# Patient Record
Sex: Female | Born: 1980
Health system: Southern US, Community
[De-identification: ages and names within clinical notes are randomized; demographics above are authoritative.]

## PROBLEM LIST (undated history)

## (undated) DIAGNOSIS — K469 Unspecified abdominal hernia without obstruction or gangrene: Secondary | ICD-10-CM

## (undated) DIAGNOSIS — Z789 Other specified health status: Secondary | ICD-10-CM

## (undated) DIAGNOSIS — G43909 Migraine, unspecified, not intractable, without status migrainosus: Secondary | ICD-10-CM

## (undated) HISTORY — DX: Unspecified abdominal hernia without obstruction or gangrene: K46.9

## (undated) HISTORY — PX: INGUINAL HERNIA REPAIR: SUR1180

## (undated) HISTORY — DX: Migraine, unspecified, not intractable, without status migrainosus: G43.909

---
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
## 2002-09-05 HISTORY — PX: WISDOM TOOTH EXTRACTION: SHX21

## 2007-02-20 ENCOUNTER — Encounter: Payer: Self-pay | Admitting: Family Medicine

## 2007-10-29 ENCOUNTER — Ambulatory Visit: Payer: Self-pay | Admitting: Family Medicine

## 2007-10-31 ENCOUNTER — Encounter: Payer: Self-pay | Admitting: Family Medicine

## 2007-11-22 ENCOUNTER — Encounter: Payer: Self-pay | Admitting: Family Medicine

## 2008-05-15 ENCOUNTER — Ambulatory Visit: Payer: Self-pay | Admitting: Family Medicine

## 2008-05-15 DIAGNOSIS — J45909 Unspecified asthma, uncomplicated: Secondary | ICD-10-CM | POA: Insufficient documentation

## 2008-07-02 ENCOUNTER — Ambulatory Visit: Payer: Self-pay | Admitting: Family Medicine

## 2008-07-02 LAB — CONVERTED CEMR LAB: Rapid Strep: NEGATIVE

## 2008-07-30 ENCOUNTER — Encounter: Payer: Self-pay | Admitting: Family Medicine

## 2008-07-30 ENCOUNTER — Ambulatory Visit: Payer: Self-pay | Admitting: Family Medicine

## 2008-07-30 ENCOUNTER — Other Ambulatory Visit: Admission: RE | Admit: 2008-07-30 | Discharge: 2008-07-30 | Payer: Self-pay | Admitting: Family Medicine

## 2008-08-01 LAB — CONVERTED CEMR LAB
ALT: 17 units/L (ref 0–35)
Albumin: 4.5 g/dL (ref 3.5–5.2)
Alkaline Phosphatase: 56 units/L (ref 39–117)
CO2: 23 meq/L (ref 19–32)
Cholesterol: 194 mg/dL (ref 0–200)
LDL Cholesterol: 103 mg/dL — ABNORMAL HIGH (ref 0–99)
Potassium: 4.3 meq/L (ref 3.5–5.3)
Sodium: 135 meq/L (ref 135–145)
Total Bilirubin: 0.4 mg/dL (ref 0.3–1.2)
Total Protein: 7.4 g/dL (ref 6.0–8.3)
VLDL: 31 mg/dL (ref 0–40)

## 2008-08-04 LAB — CONVERTED CEMR LAB: Pap Smear: NORMAL

## 2008-08-26 ENCOUNTER — Ambulatory Visit: Payer: Self-pay | Admitting: Family Medicine

## 2008-08-26 ENCOUNTER — Encounter: Admission: RE | Admit: 2008-08-26 | Discharge: 2008-08-26 | Payer: Self-pay | Admitting: Family Medicine

## 2008-09-15 ENCOUNTER — Telehealth: Payer: Self-pay | Admitting: Family Medicine

## 2008-10-15 ENCOUNTER — Ambulatory Visit: Payer: Self-pay | Admitting: Family Medicine

## 2008-11-06 ENCOUNTER — Ambulatory Visit: Payer: Self-pay | Admitting: Obstetrics & Gynecology

## 2008-11-07 ENCOUNTER — Encounter: Payer: Self-pay | Admitting: Obstetrics and Gynecology

## 2008-11-07 LAB — CONVERTED CEMR LAB
Band Neutrophils: 0 % (ref 0–10)
Hemoglobin: 12.7 g/dL (ref 12.0–15.0)
Hepatitis B Surface Ag: NEGATIVE
Lymphocytes Relative: 23 % (ref 12–46)
Lymphs Abs: 1.8 10*3/uL (ref 0.7–4.0)
Neutrophils Relative %: 68 % (ref 43–77)
RBC: 4.54 M/uL (ref 3.87–5.11)
Rubella: 180 intl units/mL — ABNORMAL HIGH
WBC: 7.9 10*3/uL (ref 4.0–10.5)

## 2008-11-17 ENCOUNTER — Encounter: Payer: Self-pay | Admitting: Obstetrics and Gynecology

## 2008-11-17 ENCOUNTER — Ambulatory Visit: Payer: Self-pay | Admitting: Obstetrics and Gynecology

## 2008-12-03 ENCOUNTER — Ambulatory Visit: Payer: Self-pay | Admitting: Family Medicine

## 2008-12-08 ENCOUNTER — Ambulatory Visit (HOSPITAL_COMMUNITY): Admission: RE | Admit: 2008-12-08 | Discharge: 2008-12-08 | Payer: Self-pay | Admitting: Obstetrics & Gynecology

## 2008-12-11 ENCOUNTER — Telehealth: Payer: Self-pay | Admitting: Family Medicine

## 2008-12-12 ENCOUNTER — Ambulatory Visit: Payer: Self-pay | Admitting: Family

## 2009-01-01 ENCOUNTER — Ambulatory Visit (HOSPITAL_COMMUNITY): Admission: RE | Admit: 2009-01-01 | Discharge: 2009-01-01 | Payer: Self-pay | Admitting: Obstetrics & Gynecology

## 2009-01-12 ENCOUNTER — Ambulatory Visit: Payer: Self-pay | Admitting: Family

## 2009-01-15 ENCOUNTER — Ambulatory Visit (HOSPITAL_COMMUNITY): Admission: RE | Admit: 2009-01-15 | Discharge: 2009-01-15 | Payer: Self-pay | Admitting: Obstetrics & Gynecology

## 2009-01-30 ENCOUNTER — Ambulatory Visit: Payer: Self-pay | Admitting: Family Medicine

## 2009-02-09 ENCOUNTER — Ambulatory Visit: Payer: Self-pay | Admitting: Family

## 2009-02-26 ENCOUNTER — Ambulatory Visit (HOSPITAL_COMMUNITY): Admission: RE | Admit: 2009-02-26 | Discharge: 2009-02-26 | Payer: Self-pay | Admitting: Obstetrics & Gynecology

## 2009-03-06 ENCOUNTER — Ambulatory Visit: Payer: Self-pay | Admitting: Obstetrics and Gynecology

## 2009-03-26 ENCOUNTER — Ambulatory Visit (HOSPITAL_COMMUNITY): Admission: RE | Admit: 2009-03-26 | Discharge: 2009-03-26 | Payer: Self-pay | Admitting: Obstetrics & Gynecology

## 2009-03-27 ENCOUNTER — Ambulatory Visit: Payer: Self-pay | Admitting: Obstetrics and Gynecology

## 2009-03-27 LAB — CONVERTED CEMR LAB
HCT: 32.2 % — ABNORMAL LOW (ref 36.0–46.0)
Hemoglobin: 10.8 g/dL — ABNORMAL LOW (ref 12.0–15.0)
MCHC: 33.5 g/dL (ref 30.0–36.0)
MCV: 89.7 fL (ref 78.0–100.0)
RDW: 13.8 % (ref 11.5–15.5)

## 2009-03-31 ENCOUNTER — Encounter: Payer: Self-pay | Admitting: Obstetrics and Gynecology

## 2009-04-10 ENCOUNTER — Ambulatory Visit: Payer: Self-pay | Admitting: Family

## 2009-04-27 ENCOUNTER — Ambulatory Visit: Payer: Self-pay | Admitting: Family

## 2009-05-07 ENCOUNTER — Ambulatory Visit (HOSPITAL_COMMUNITY): Admission: RE | Admit: 2009-05-07 | Discharge: 2009-05-07 | Payer: Self-pay | Admitting: Obstetrics & Gynecology

## 2009-05-12 ENCOUNTER — Ambulatory Visit: Payer: Self-pay | Admitting: Obstetrics & Gynecology

## 2009-05-13 ENCOUNTER — Encounter: Payer: Self-pay | Admitting: Obstetrics & Gynecology

## 2009-05-17 ENCOUNTER — Ambulatory Visit: Payer: Self-pay | Admitting: Obstetrics and Gynecology

## 2009-05-17 ENCOUNTER — Inpatient Hospital Stay (HOSPITAL_COMMUNITY): Admission: AD | Admit: 2009-05-17 | Discharge: 2009-05-17 | Payer: Self-pay | Admitting: Obstetrics & Gynecology

## 2009-05-20 ENCOUNTER — Ambulatory Visit: Payer: Self-pay | Admitting: Obstetrics & Gynecology

## 2009-05-21 ENCOUNTER — Encounter: Payer: Self-pay | Admitting: Obstetrics & Gynecology

## 2009-05-21 LAB — CONVERTED CEMR LAB: Chlamydia, Swab/Urine, PCR: NEGATIVE

## 2009-05-29 ENCOUNTER — Ambulatory Visit: Payer: Self-pay | Admitting: Physician Assistant

## 2009-06-01 ENCOUNTER — Inpatient Hospital Stay (HOSPITAL_COMMUNITY): Admission: AD | Admit: 2009-06-01 | Discharge: 2009-06-01 | Payer: Self-pay | Admitting: Obstetrics and Gynecology

## 2009-06-01 ENCOUNTER — Inpatient Hospital Stay (HOSPITAL_COMMUNITY): Admission: AD | Admit: 2009-06-01 | Discharge: 2009-06-04 | Payer: Self-pay | Admitting: Obstetrics & Gynecology

## 2009-06-01 ENCOUNTER — Ambulatory Visit: Payer: Self-pay | Admitting: Obstetrics and Gynecology

## 2009-06-05 ENCOUNTER — Ambulatory Visit: Payer: Self-pay | Admitting: Family Medicine

## 2009-06-19 ENCOUNTER — Ambulatory Visit: Payer: Self-pay | Admitting: Physician Assistant

## 2009-06-24 ENCOUNTER — Ambulatory Visit: Payer: Self-pay | Admitting: Obstetrics & Gynecology

## 2009-06-25 ENCOUNTER — Encounter: Payer: Self-pay | Admitting: Obstetrics & Gynecology

## 2009-06-25 LAB — CONVERTED CEMR LAB
Clue Cells Wet Prep HPF POC: NONE SEEN
Trich, Wet Prep: NONE SEEN

## 2009-07-13 ENCOUNTER — Ambulatory Visit: Payer: Self-pay | Admitting: Family Medicine

## 2009-07-17 ENCOUNTER — Ambulatory Visit: Payer: Self-pay | Admitting: Physician Assistant

## 2009-10-17 ENCOUNTER — Ambulatory Visit: Payer: Self-pay | Admitting: Family Medicine

## 2009-10-17 DIAGNOSIS — J019 Acute sinusitis, unspecified: Secondary | ICD-10-CM

## 2009-10-17 LAB — CONVERTED CEMR LAB: Rapid Strep: NEGATIVE

## 2009-10-19 ENCOUNTER — Encounter: Payer: Self-pay | Admitting: Family Medicine

## 2010-06-03 ENCOUNTER — Ambulatory Visit: Payer: Self-pay | Admitting: Family Medicine

## 2010-07-27 ENCOUNTER — Ambulatory Visit: Payer: Self-pay | Admitting: Obstetrics & Gynecology

## 2010-08-18 ENCOUNTER — Ambulatory Visit: Payer: Self-pay | Admitting: Obstetrics & Gynecology

## 2010-08-23 ENCOUNTER — Ambulatory Visit: Payer: Self-pay | Admitting: Advanced Practice Midwife

## 2010-08-24 ENCOUNTER — Encounter: Payer: Self-pay | Admitting: Obstetrics and Gynecology

## 2010-08-24 LAB — CONVERTED CEMR LAB
Basophils Absolute: 0 10*3/uL (ref 0.0–0.1)
Eosinophils Relative: 1 % (ref 0–5)
HCT: 39.5 % (ref 36.0–46.0)
Hepatitis B Surface Ag: NEGATIVE
Lymphocytes Relative: 23 % (ref 12–46)
Platelets: 211 10*3/uL (ref 150–400)
RDW: 13.6 % (ref 11.5–15.5)

## 2010-09-05 NOTE — L&D Delivery Note (Signed)
At 0112 this G2P002 delivered a viable female infant weighing by SVD in hands and knees position without difficulty;  Apgars 9/9 under no anesthesia. No nuchal cord.  Baby was placed on mom's abdomen for skin to skin contact. Cord was clamped and cut. Intact 3 vessel cord; placenta delivered spontaneously and intact. Cervix and vagina were inspected. 2nd degree perineal laceration repaired with 3.0 monocryl; left labial lac repaired with 4.0 vicryl. Mother and baby stable for transfer to Mother-Baby Unit.  Dr. Emelda Fear attending.    [facility]

## 2010-09-20 ENCOUNTER — Ambulatory Visit
Admission: RE | Admit: 2010-09-20 | Discharge: 2010-09-20 | Payer: Self-pay | Source: Home / Self Care | Attending: Physician Assistant | Admitting: Physician Assistant

## 2010-10-05 NOTE — Assessment & Plan Note (Signed)
Summary: flu shot  Nurse Visit   Allergies: 1)  ! Amoxicillin  Immunizations Administered:  Influenza Vaccine # 1:    Vaccine Type: Fluvax 3+    Site: left deltoid    Mfr: GlaxoSmithKline    Dose: 0.5 ml    Route: IM    Given by: Sue Lush McCrimmon CMA, (AAMA)    Exp. Date: 03/05/2011    Lot #: [id number]    VIS given: 03/30/10 version given June 04, 2010.  Flu Vaccine Consent Questions:    Do you have a history of severe allergic reactions to this vaccine? no    Any prior history of allergic reactions to egg and/or gelatin? no    Do you have a sensitivity to the preservative Thimersol? no    Do you have a past history of Guillan-Barre Syndrome? no    Do you currently have an acute febrile illness? no    Have you ever had a severe reaction to latex? no    Vaccine information given and explained to patient? no    Are you currently pregnant? no  Orders Added: 1)  Flu Vaccine 58yrs + [90658] 2)  Admin 1st Vaccine [90471]    I

## 2010-10-05 NOTE — Assessment & Plan Note (Signed)
Summary: Runny nose-yellow, Jawpain, eye pain x 2 dys rm 1   Vital Signs:  Patient Profile:   30 Years Old Female CC:      Cold & URI symptoms Height:     63.2 inches Weight:      139 pounds O2 Sat:      99 % O2 treatment:    Room Air Temp:     97.1 degrees F oral Pulse rate:   97 / minute Pulse rhythm:   regular Resp:     16 per minute BP sitting:   121 / 77  (right arm) Cuff size:   regular  Vitals Entered By: Areta Haber CMA (October 17, 2009 2:03 PM)                  Prior Medication List:  PROAIR HFA 108 (90 BASE) MCG/ACT AERS (ALBUTEROL SULFATE) 2-4 puffs inhaled every 4-6 hours as needed * PRENATAL VITAMIN Take 1 tablet by mouth once a day CEPHALEXIN 500 MG CAPS (CEPHALEXIN) Take 1 tablet by mouth four times a day for 7 days   Current Allergies (reviewed today): ! AMOXICILLIN    History of Present Illness Chief Complaint: Cold & URI symptoms History of Present Illness: Everything started Thursday w/ asore throat and running nose. Late last night sinus presure and pain to go w/ cough. No fever.   Current Problems: ACUTE SINUSITIS, UNSPECIFIED (ICD-461.9) ACUTE NASOPHARYNGITIS (ICD-460) MASTITIS (ICD-611.0) URI (ICD-465.9) CONTRACEPTIVE MANAGEMENT (ICD-V25.09) INTRINSIC ASTHMA, UNSPECIFIED (ICD-493.10) OTH GENERAL MEDICAL EXAMINATION ADMIN PURPOSES (ICD-V70.3)   Current Meds PROAIR HFA 108 (90 BASE) MCG/ACT AERS (ALBUTEROL SULFATE) 2-4 puffs inhaled every 4-6 hours as needed * PRENATAL VITAMIN Take 1 tablet by mouth once a day SUDAFED 30 MG TABS (PSEUDOEPHEDRINE HCL) 2 tabs by mouth as needed ERRIN 0.35 MG TABS (NORETHINDRONE) 1 tab by mouth once daily ZITHROMAX Z-PAK 250 MG  TABS (AZITHROMYCIN) Use as directed RHINOCORT AQUA 32 MCG/ACT SUSP (BUDESONIDE (NASAL)) 1 spray qday in each nostil  REVIEW OF SYSTEMS Constitutional Symptoms      Denies fever, chills, night sweats, weight loss, weight gain, and fatigue.  Eyes       Complains of eye pain.       Denies change in vision, eye discharge, glasses, contact lenses, and eye surgery. Ear/Nose/Throat/Mouth       Complains of frequent runny nose and sinus problems.      Denies hearing loss/aids, change in hearing, ear pain, ear discharge, dizziness, frequent nose bleeds, sore throat, hoarseness, and tooth pain or bleeding.      Comments: x 2 dys Jaw pain Respiratory       Denies dry cough, productive cough, wheezing, shortness of breath, asthma, bronchitis, and emphysema/COPD.  Cardiovascular       Denies murmurs, chest pain, and tires easily with exhertion.    Gastrointestinal       Denies stomach pain, nausea/vomiting, diarrhea, constipation, blood in bowel movements, and indigestion. Genitourniary       Denies painful urination, kidney stones, and loss of urinary control. Neurological       Denies paralysis, seizures, and fainting/blackouts. Musculoskeletal       Denies muscle pain, joint pain, joint stiffness, decreased range of motion, redness, swelling, muscle weakness, and gout.  Skin       Denies bruising, unusual mles/lumps or sores, and hair/skin or nail changes.  Psych       Denies mood changes, temper/anger issues, anxiety/stress, speech problems, depression, and sleep problems.  Past History:  Past Medical History: Last updated: 01/30/2009 Exercise induced asthma. Never hospitalized.  pregnant now pink eye in February this year  Past Surgical History: Last updated: 10/29/2007 None  Family History: Last updated: 01/30/2009 Grandparents with DM, MI, Lung Ca, stroke mother, sister, brother alive and healthy father history of p rostate cancer  Social History: Last updated: 10/17/2009   Married to [employer].  One children.  Never Smoked Alcohol use-no Drug use-no Regular exercise-yes  Risk Factors: Caffeine Use: 0 (10/29/2007) Exercise: yes (10/29/2007)  Risk Factors: Smoking Status: never (10/29/2007)  Social History:   Married to [employer].  One children.  Never Smoked Alcohol use-no Drug use-no Regular exercise-yes Physical Exam General appearance: well developed, well nourished, no acute distress Head: normocephalic, atraumatic Ears: normal, no lesions or deformities Nasal: swollen red turbinates with congestion Oral/Pharynx: pharyngeal erythema without exudate, uvula midline without deviation post nasal drainage visualized Neck: supple,anterior lymphadenopathy present Chest/Lungs: no rales, wheezes, or rhonchi bilateral, breath sounds equal without effort Heart: regular rate and  rhythm, no murmur Skin: no obvious rashes or lesions MSE: oriented to time, place, and person Assessment New Problems: ACUTE SINUSITIS, UNSPECIFIED (ICD-461.9) ACUTE NASOPHARYNGITIS (ICD-460)  sinusitis  Patient Education: Patient and/or caregiver instructed in the following: rest fluids and Tylenol.  Plan New Medications/Changes: RHINOCORT AQUA 32 MCG/ACT SUSP (BUDESONIDE (NASAL)) 1 spray qday in each nostil  #1 x 0, 10/17/2009, Hassan Rowan MD ZITHROMAX Z-PAK 250 MG  TABS (AZITHROMYCIN) Use as directed  #1 x 0, 10/17/2009, Hassan Rowan MD  New Orders: Rapid Strep [[id number]] T-Culture, Rapid Strep [[id number]] New Patient Level III [32440] Planning Comments:   as below  Follow Up: Follow up in 2-3 days if no improvement, Follow up on an as needed basis, Follow up with Primary Physician  The patient and/or caregiver has been counseled thoroughly with regard to medications prescribed including dosage, schedule, interactions, rationale for use, and possible side effects and they verbalize understanding.  Diagnoses and expected course of recovery discussed and will return if not improved as expected or if the condition worsens. Patient and/or caregiver verbalized understanding.  Prescriptions: RHINOCORT AQUA 32 MCG/ACT SUSP (BUDESONIDE (NASAL)) 1 spray qday in each nostil  #1 x 0   Entered and Authorized by:   Hassan Rowan MD    Signed by:   Hassan Rowan MD on 10/17/2009   Method used:   Printed then faxed to ...       [facility] (retail)       [address] [place], [place]  [address]       Ph: [phone]       Fax: [phone]   RxID:   [id number] ZITHROMAX Z-PAK 250 MG  TABS (AZITHROMYCIN) Use as directed  #1 x 0   Entered and Authorized by:   Hassan Rowan MD   Signed by:   Hassan Rowan MD on 10/17/2009   Method used:   Printed then faxed to ...       [facility] (retail)       [address] [place], [place]  [address]       Ph: [phone]       Fax: [phone]   RxID:   [id number]   Patient Instructions: 1)  Please schedule a follow-up appointment as needed. 2)  Please schedule an appointment with your primary doctor in : 3)  Take your antibiotic as prescribed  until ALL of it is gone, but stop if you develop a rash or swelling and contact our office as soon as possible. 4)  Acute sinusitis symptoms for less than 10 days are not helped by antibiotics.Use warm moist compresses, and over the counter decongestants ( only as directed). Call if no improvement in 5-7 days, sooner if increasing pain, fever, or new symptoms.  Laboratory Results  Date/Time Received: October 17, 2009 3:31 PM  Date/Time Reported: October 17, 2009 3:31 PM   Other Tests  Rapid Strep: negative  Kit Test Internal QC: Negative   (Normal Range: Negative)

## 2010-10-05 NOTE — Letter (Signed)
Summary: Internal Correspondence-Call A Nurse Report  Internal Correspondence-Call A Nurse Report   Imported By: Vanessa Swaziland 10/19/2009 08:36:03  _____________________________________________________________________  External Attachment:    Type:   Image     Comment:   internal Document

## 2010-10-19 ENCOUNTER — Encounter: Payer: 59 | Admitting: Obstetrics & Gynecology

## 2010-10-19 DIAGNOSIS — Z3480 Encounter for supervision of other normal pregnancy, unspecified trimester: Secondary | ICD-10-CM

## 2010-10-20 ENCOUNTER — Other Ambulatory Visit: Payer: Self-pay | Admitting: Obstetrics & Gynecology

## 2010-10-20 DIAGNOSIS — Z3689 Encounter for other specified antenatal screening: Secondary | ICD-10-CM

## 2010-10-29 DIAGNOSIS — Z3480 Encounter for supervision of other normal pregnancy, unspecified trimester: Secondary | ICD-10-CM

## 2010-11-04 ENCOUNTER — Ambulatory Visit (HOSPITAL_COMMUNITY)
Admission: RE | Admit: 2010-11-04 | Discharge: 2010-11-04 | Disposition: A | Discharge status: Home / Self Care | Payer: 59 | Source: Ambulatory Visit | Attending: Obstetrics & Gynecology | Admitting: Obstetrics & Gynecology

## 2010-11-04 DIAGNOSIS — Z3689 Encounter for other specified antenatal screening: Secondary | ICD-10-CM

## 2010-11-04 DIAGNOSIS — Z1389 Encounter for screening for other disorder: Secondary | ICD-10-CM | POA: Insufficient documentation

## 2010-11-04 DIAGNOSIS — Z363 Encounter for antenatal screening for malformations: Secondary | ICD-10-CM | POA: Insufficient documentation

## 2010-11-04 DIAGNOSIS — O358XX0 Maternal care for other (suspected) fetal abnormality and damage, not applicable or unspecified: Secondary | ICD-10-CM | POA: Insufficient documentation

## 2010-11-16 ENCOUNTER — Encounter: Payer: 59 | Admitting: Obstetrics & Gynecology

## 2010-11-19 ENCOUNTER — Encounter: Payer: Self-pay | Admitting: Family

## 2010-11-19 DIAGNOSIS — Z3480 Encounter for supervision of other normal pregnancy, unspecified trimester: Secondary | ICD-10-CM

## 2010-11-19 LAB — CONVERTED CEMR LAB
HCT: 32.6 % — ABNORMAL LOW (ref 36.0–46.0)
Hemoglobin: 11.5 g/dL — ABNORMAL LOW (ref 12.0–15.0)
MCV: 88.6 fL (ref 78.0–100.0)
RBC: 3.68 M/uL — ABNORMAL LOW (ref 3.87–5.11)
WBC: 8.4 10*3/uL (ref 4.0–10.5)

## 2010-12-07 ENCOUNTER — Institutional Professional Consult (permissible substitution) (INDEPENDENT_AMBULATORY_CARE_PROVIDER_SITE_OTHER): Payer: 59 | Admitting: Nurse Practitioner

## 2010-12-07 DIAGNOSIS — G43019 Migraine without aura, intractable, without status migrainosus: Secondary | ICD-10-CM

## 2010-12-09 NOTE — Assessment & Plan Note (Unsigned)
NAME:  [name] Green, [name] Green NO.:  [id number]  MEDICAL RECORD NO.:  [id number]           PATIENT TYPE:  LOCATION:  [facility] [place]           FACILITY:  PHYSICIAN:  Elsie Lincoln, MD      DATE OF BIRTH:  [birth year]  DATE OF SERVICE:  12/07/2010                                 CLINIC NOTE  HISTORY:  The patient comes to office today for consultation on her migraine headaches.  Patient is currently 23 weeks and 5 days pregnant. She is a G2, P1.  The patient feels that her mother likely has headaches, so that she has not been formally diagnosed.  She remembers having her first headache back when she was in high school in [place], [place], and these would occur when she became stressed with her activities and out in the sunshine.  She also remembers another time in her life when she had headaches around taking birth control pills.  She also recalls an episode of headaches when she was diagnosed with [facility] spotted fever.  She did not remember having headaches with her last pregnancy, but with this pregnancy she has been having approximately 4 severe headaches per month, 4 moderate headaches per month and 8 mild headaches.  She admits to having photophobia, phonophobia.  No nausea or vomiting.  She does feel the need to rest and movement makes her worse.  She has also noticed an increased irritability which is likely her prodrome.  The patient's headaches are in her bilateral temples.  She has been taking Tylenol which seems to work for everything, but the severe headaches.  PAST MEDICAL HISTORY:  Asthma.  She takes albuterol as needed.  She has not to take that through this pregnancy.  She has also had a history of [facility] spotted fever.  SOCIAL HISTORY:  The patient stays with her mom.  Her husband works at [employer] with his father.  Triggers include hormones, stress, dehydration, eating.  BIRTH CONTROL:  Possible IUD or husband  may get vasectomy.  She is not interested in having any more children naturally if they have another child and they wish to adopt a boy.  PHYSICAL EXAMINATION:  VITAL SIGNS:  Blood pressure is 115/67, pulse 104, weight 149. HEENT:  Head is normocephalic and atraumatic.  Pupils equal and reactive. NEUROLOGIC:  The patient is alert, oriented.  She has good movements. Good muscle tone.  Good coordination.  Good sensation.  She has appropriate affect.  Not overly anxious. CARDIAC:  Regular rate and rhythm. LUNGS:  Clear bilaterally without rales or rhonchi. EXTREMITIES:  Warm and dry.  ASSESSMENT: 1. Pregnancy. 2. Migraine in pregnancy without aura.  PLAN:  The patient seems to be doing well for the most part using Tylenol, which she is okay to continue doing.  She will be given Tylenol #3 for rescue.  She can take one p.o. q.4 h. p.r.n. #20 with no refills. She is aware of not taking the Tylenol #3 in conjunction with her regular Tylenol.  We also discussed many nonmedication management trials including water therapy, ice, massage, physical therapy and yoga.  We spent approximately 45 minutes today discussing migraine mechanisms and treatments and therapies.  The patient is  asked to return only if need be.  Otherwise, she will continue with her prenatal care.  She is certainly free to call me or return to see me at any time.     Remonia Richter, NP   ______________________________ Elsie Lincoln, MD   LR/MEDQ  D:  12/07/2010  T:  12/08/2010  Job:  [id number]

## 2010-12-10 LAB — CBC
HCT: 37.5 % (ref 36.0–46.0)
MCHC: 33.9 g/dL (ref 30.0–36.0)
MCHC: 34.4 g/dL (ref 30.0–36.0)
MCV: 89.3 fL (ref 78.0–100.0)
Platelets: 140 10*3/uL — ABNORMAL LOW (ref 150–400)
RDW: 13.9 % (ref 11.5–15.5)
RDW: 14.1 % (ref 11.5–15.5)

## 2010-12-20 ENCOUNTER — Ambulatory Visit (INDEPENDENT_AMBULATORY_CARE_PROVIDER_SITE_OTHER): Payer: 59 | Admitting: Family Medicine

## 2010-12-20 DIAGNOSIS — J40 Bronchitis, not specified as acute or chronic: Secondary | ICD-10-CM

## 2010-12-20 DIAGNOSIS — Z3480 Encounter for supervision of other normal pregnancy, unspecified trimester: Secondary | ICD-10-CM

## 2010-12-20 DIAGNOSIS — J329 Chronic sinusitis, unspecified: Secondary | ICD-10-CM

## 2010-12-20 MED ORDER — ALBUTEROL 90 MCG/ACT IN AERS: 2.0000 | INHALATION_SPRAY | Freq: Four times a day (QID) | RESPIRATORY_TRACT | Status: DC | PRN

## 2010-12-20 MED ORDER — AZITHROMYCIN 250 MG PO TABS: ORAL_TABLET | ORAL | Status: AC

## 2010-12-20 NOTE — Progress Notes (Signed)
  Subjective:    Patient ID: [name] Green, female    DOB: 09/07/1980, 30 y.o.   MRN: [id number]  Cough This is a new problem. The current episode started in the past 7 days. The problem occurs hourly. The cough is productive of sputum. Associated symptoms include nasal congestion, postnasal drip, rhinorrhea, a sore throat, shortness of breath and weight loss. Pertinent negatives include no chest pain, ear congestion, fever, rash or wheezing. The symptoms are aggravated by nothing (worse at night. ). She has tried nothing for the symptoms. The treatment provided no relief.  She is 6 months pregnant    Review of Systems  Constitutional: Positive for weight loss. Negative for fever.  HENT: Positive for sore throat, rhinorrhea and postnasal drip.   Respiratory: Positive for cough and shortness of breath. Negative for wheezing.   Cardiovascular: Negative for chest pain.  Skin: Negative for rash.       Objective:   Physical Exam  Constitutional: She is oriented to person, place, and time. She appears well-developed and well-nourished.       Cough while talking.   HENT:  Head: Normocephalic and atraumatic.  Right Ear: External ear normal.  Left Ear: External ear normal.  Nose: Nose normal.  Mouth/Throat: Oropharynx is clear and moist.  Eyes: Conjunctivae and EOM are normal. Pupils are equal, round, and reactive to light.  Neck: Neck supple. No thyromegaly present.  Cardiovascular: Normal rate, regular rhythm and normal heart sounds.   Pulmonary/Chest: Effort normal and breath sounds normal.       Coarse BS at the bases bilat.   Lymphadenopathy:    She has no cervical adenopathy.  Neurological: She is alert and oriented to person, place, and time.  Skin: Skin is warm and dry.  Psychiatric: She has a normal mood and affect.          Assessment & Plan:  Sinusitis/Bronchitis - symptomatic tx with nasal saline. Also will tx with zpack which will also cover bronchitis.  May be  viral. Call if not better in 10 days.  Cna use her inhaler if needed since she has hx of exercise induced asthma.

## 2010-12-23 ENCOUNTER — Ambulatory Visit: Payer: 59 | Admitting: Family Medicine

## 2010-12-24 ENCOUNTER — Encounter: Payer: Self-pay | Admitting: Family Medicine

## 2011-01-14 ENCOUNTER — Encounter (INDEPENDENT_AMBULATORY_CARE_PROVIDER_SITE_OTHER): Payer: 59

## 2011-01-14 DIAGNOSIS — Z3480 Encounter for supervision of other normal pregnancy, unspecified trimester: Secondary | ICD-10-CM

## 2011-01-14 LAB — RPR: RPR: NONREACTIVE

## 2011-01-18 NOTE — Assessment & Plan Note (Signed)
NAME:  [name] Green, [name] Green NO.:  [id number]   MEDICAL RECORD NO.:  [id number]          PATIENT TYPE:  POB   LOCATION:  [facility] [place]         FACILITY:  [facility]   PHYSICIAN:  Maylon Cos, CNM    DATE OF BIRTH:  06-Feb-1981   DATE OF SERVICE:  07/17/2009                                  CLINIC NOTE   REASON FOR VISIT:  Six-week postpartum check.   HISTORY OF PRESENT ILLNESS:  The patient had a vaginal delivery and  under epidural anesthesia on June 02, 2009, she had a vacuum-  assisted vaginal delivery attended by Dr. Sid Falcon and Dr. Catalina Antigua secondary to prolonged second stage maternal fever and fetal  tachycardia.  She delivered a viable female with Apgars of 7 and 9,  weighing 7 pounds and 8 ounces.  She did have a second-degree laceration  that was repaired.  She is doing very well today.  She has been seen  twice in our office since delivery secondary to vaginal itching and  burning.  She had wet preps done in the office that was found to be  normal.  No signs of bacterial or fungal infections.  Her itching and  discomfort is finally resolved with the use of plain yogurt douches.  She has no complaints today, however, she was recently treated on Monday  for an episode of right breast mastitis by her primary care Copper Kirtley and  is currently taking Keflex.  She has not resumed intercourse.  She is  planning to take Camila which she received a prescription for several  weeks ago from our office.  She is exclusively breast-feeding and taking  prenatal vitamins on a regular basis.  She has no signs and symptoms of  depression.  She is sleeping, feels like she is getting adequate sleep  and eating and drinking regularly and feels like her nutrition is  adequate as well.  She has lost 30 pounds since delivery and is  exercising on a regular basis.   PHYSICAL EXAMINATION:  GENERAL:  On examination today, [name] Green is a  pleasant 30 year old  Caucasian female, in no apparent distress.  VITAL SIGNS:  Stable.  Her pulse is 77.  Her blood pressure is 117/75.  Her weight today is 143.  Her height is 62 inches.  HEENT:  Grossly normal.  She has good dentition.  BREASTS:  Soft and nontender.  Her right breast is slightly firm but  nonenlarged, non-engorged.  There are no cords noted, has normal body  temperature.  There is no erythema noted.  A small amount of milk can be  expressed with gentle palpation.  There is no cracking or scabbing noted  of her nipple.  Mastitis appears to be resolving well on oral  antibiotics.  Her left breast is soft and nontender.  ABDOMEN:  Soft and nontender.  She does have a small amount of  loose  skin and xerostasis.  PELVIC:  Deferred secondary to no complaints.   ASSESSMENT:  Six-week postpartum status post vacuum-assisted vaginal  delivery, doing well, breast-feeding, contraception is progestin-only  pills, previous vaginitis has now resolved.   PLAN:  The patient may now resume intercourse.  She is  instructed to  begin her Camila PIP on Sunday.  She is instructed to also use backup  method of condoms for the first 7 days if she becomes sexually active  during this first 7 days of pills.   HEALTH MAINTENANCE:  She should continue her prenatal vitamins daily  while breast-feeding and transition to a multivitamin thereafter.  Continue her diet and exercise routine as normal.  Her last Pap smear  was performed in March 2010.  She has had no previous abnormal Pap smear  and will not need one for 2 years.  However, she should need an annual  physical exam in March.  She is doing very well and should follow up  with [place] in March or any time prior to that if problem should arise.           ______________________________  Maylon Cos, CNM     SS/MEDQ  D:  07/17/2009  T:  07/18/2009  Job:  [id number]

## 2011-01-28 ENCOUNTER — Encounter (INDEPENDENT_AMBULATORY_CARE_PROVIDER_SITE_OTHER): Payer: 59

## 2011-01-28 DIAGNOSIS — Z3480 Encounter for supervision of other normal pregnancy, unspecified trimester: Secondary | ICD-10-CM

## 2011-02-11 ENCOUNTER — Encounter (INDEPENDENT_AMBULATORY_CARE_PROVIDER_SITE_OTHER): Payer: 59

## 2011-02-11 DIAGNOSIS — T360X4A Poisoning by penicillins, undetermined, initial encounter: Secondary | ICD-10-CM

## 2011-02-11 DIAGNOSIS — Z3480 Encounter for supervision of other normal pregnancy, unspecified trimester: Secondary | ICD-10-CM

## 2011-02-25 ENCOUNTER — Encounter (INDEPENDENT_AMBULATORY_CARE_PROVIDER_SITE_OTHER): Payer: 59

## 2011-02-25 DIAGNOSIS — Z3480 Encounter for supervision of other normal pregnancy, unspecified trimester: Secondary | ICD-10-CM

## 2011-03-11 ENCOUNTER — Encounter (INDEPENDENT_AMBULATORY_CARE_PROVIDER_SITE_OTHER): Payer: 59

## 2011-03-11 DIAGNOSIS — Z3480 Encounter for supervision of other normal pregnancy, unspecified trimester: Secondary | ICD-10-CM

## 2011-03-16 ENCOUNTER — Encounter: Payer: Self-pay | Admitting: *Deleted

## 2011-03-18 ENCOUNTER — Encounter (HOSPITAL_COMMUNITY): Payer: Self-pay | Admitting: *Deleted

## 2011-03-18 ENCOUNTER — Encounter (INDEPENDENT_AMBULATORY_CARE_PROVIDER_SITE_OTHER): Payer: 59

## 2011-03-18 ENCOUNTER — Inpatient Hospital Stay (HOSPITAL_COMMUNITY)
Admission: RE | Admit: 2011-03-18 | Discharge: 2011-03-20 | DRG: 775 | Disposition: A | Discharge status: Home / Self Care | Payer: 59 | Source: Ambulatory Visit | Attending: Obstetrics and Gynecology | Admitting: Obstetrics and Gynecology

## 2011-03-18 DIAGNOSIS — Z3480 Encounter for supervision of other normal pregnancy, unspecified trimester: Secondary | ICD-10-CM

## 2011-03-18 LAB — CBC
HCT: 36.3 % (ref 36.0–46.0)
Hemoglobin: 12.4 g/dL (ref 12.0–15.0)
MCHC: 34.2 g/dL (ref 30.0–36.0)
MCV: 86.2 fL (ref 78.0–100.0)
RDW: 14.2 % (ref 11.5–15.5)

## 2011-03-18 LAB — GC/CHLAMYDIA PROBE AMP, GENITAL: Gonorrhea: NEGATIVE

## 2011-03-18 LAB — STREP B DNA PROBE: GBS: NEGATIVE

## 2011-03-18 MED ORDER — LACTATED RINGERS IV SOLN
INTRAVENOUS | Status: DC
  Administered 2011-03-18: 21:00:00 via INTRAVENOUS

## 2011-03-18 MED ORDER — LIDOCAINE HCL (PF) 1 % IJ SOLN
30.0000 mL | INTRAMUSCULAR | Status: DC | PRN
  Filled 2011-03-18 (×2): qty 30

## 2011-03-18 MED ORDER — CITRIC ACID-SODIUM CITRATE 334-500 MG/5ML PO SOLN: 30.0000 mL | ORAL | Status: DC | PRN

## 2011-03-18 MED ORDER — LACTATED RINGERS IV SOLN: 500.0000 mL | INTRAVENOUS | Status: DC | PRN

## 2011-03-18 MED ORDER — IBUPROFEN 600 MG PO TABS
600.0000 mg | ORAL_TABLET | Freq: Four times a day (QID) | ORAL | Status: DC | PRN
  Administered 2011-03-19: 600 mg via ORAL
  Filled 2011-03-18 (×5): qty 1

## 2011-03-18 MED ORDER — ONDANSETRON HCL 4 MG/2ML IJ SOLN: 4.0000 mg | Freq: Four times a day (QID) | INTRAMUSCULAR | Status: DC | PRN

## 2011-03-18 MED ORDER — ALBUTEROL 90 MCG/ACT IN AERS: 2.0000 | INHALATION_SPRAY | Freq: Four times a day (QID) | RESPIRATORY_TRACT | Status: DC | PRN

## 2011-03-18 MED ORDER — ALBUTEROL SULFATE HFA 108 (90 BASE) MCG/ACT IN AERS
2.0000 | INHALATION_SPRAY | Freq: Four times a day (QID) | RESPIRATORY_TRACT | Status: DC | PRN
  Filled 2011-03-18: qty 6.7

## 2011-03-18 MED ORDER — FLEET ENEMA 7-19 GM/118ML RE ENEM: 1.0000 | ENEMA | RECTAL | Status: DC | PRN

## 2011-03-18 MED ORDER — ACETAMINOPHEN 325 MG PO TABS: 650.0000 mg | ORAL_TABLET | ORAL | Status: DC | PRN

## 2011-03-18 MED ORDER — OXYCODONE-ACETAMINOPHEN 5-325 MG PO TABS
2.0000 | ORAL_TABLET | ORAL | Status: DC | PRN
  Administered 2011-03-19: 2 via ORAL
  Filled 2011-03-18: qty 2

## 2011-03-18 MED ORDER — OXYTOCIN 20 UNITS IN LACTATED RINGERS INFUSION - SIMPLE
125.0000 mL/h | Freq: Once | INTRAVENOUS | Status: DC
  Administered 2011-03-19: 999 mL/h via INTRAVENOUS
  Filled 2011-03-18: qty 1000

## 2011-03-18 NOTE — Progress Notes (Signed)
[name] Green is a 30 y.o. G2P1001 at [redacted]w[redacted]d by ultrasound admitted for active labor  Subjective:   Objective: BP 104/88  Pulse 90  Temp(Src) 97.7 F (36.5 C) (Oral)  Resp 18  Ht 5\' 3"  (1.6 m)  Wt 165 lb (74.844 kg)  BMI 29.23 kg/m2  Breastfeeding? Unknown      FHT:  FHR: 155 bpm, variability: moderate,  accelerations:  Present,  decelerations:  Absent UC:   regular, every 4 minutes SVE:   Dilation: 6.5 Effacement (%): 90 Station: -2 Exam by:: Dr [name] Green  Labs: Lab Results  Component Value Date   WBC 9.8 03/18/2011   HGB 12.4 03/18/2011   HCT 36.3 03/18/2011   MCV 86.2 03/18/2011   PLT 139* 03/18/2011    Assessment / Plan: Spontaneous labor, progressing normally  Labor: Progressing normally Fetal Wellbeing:  Category I Pain Control:  Labor support without medications I/D:  n/a Anticipated MOD:  NSVD  [name] Green N 03/18/2011, 11:42 PM

## 2011-03-18 NOTE — H&P (Signed)
  Subjective:  [name] Green is a 30 y.o. G2 P1 female with [facility] 04/01/11 at 38wga who is being admitted for labor management. No significant history for this pregnancy. Pt was seen at [facility] and was 5cm at 10am. Pt reports contractions.   Fetal Movement: normal.     Objective:   Vital signs in last 24 hours: Temp:  [98.5 F (36.9 C)-98.6 F (37 C)] 98.5 F (36.9 C) (07/13 0454) Pulse Rate:  [98-105] 98  (07/13 2058) Resp:  [18-20] 18  (07/13 2058) BP: (131)/(74-81) 131/74 mmHg (07/13 2058) Weight:  [165 lb (74.844 kg)-165 lb 6 oz (75.014 kg)] 165 lb (74.844 kg) (07/13 2109)   General:   alert and cooperative  Skin:   normal  HEENT:  PERRLA and extra ocular movement intact  Lungs:   clear to auscultation bilaterally  Heart:   regular rate and rhythm, S1, S2 normal, no murmur, click, rub or gallop     Abdomen:  gravid  Pelvis:  Exam deferred.  FHT:  150 BPM, moderate variability, positive accels, no decels.    TOCO: 2-41min  Presentations: vertex  Cervix:    Dilation: 5-6   Effacement: 70   Station:  -2   Consistency: soft   Position: middle   Lab Review O+, neg antibody Rubella immune HbsAg neg RPR neg GC/Chl neg HIV neg GBS neg One hour GTT: Normal    Assessment/Plan:  38 and 0/[redacted] weeks gestation. Active phase labor. Uncomplicated OB history.  Pt would like to be delivered by nurse mid wife.     Risks, benefits, alternatives and possible complications have been discussed in detail with the patient.  Pre-admission, admission, and post admission procedures and expectations were discussed in detail.  All questions answered, all appropriate consents will be signed at the Hospital. Admission is planned for today.  Expectant management. and Anticipate vaginal delivery.

## 2011-03-18 NOTE — Plan of Care (Signed)
Problem: Consults Goal: Birthing Suites Patient Information Press F2 to bring up selections list Outcome: Completed/Met Date Met:  03/18/11  Pt 37-[redacted] weeks EGA

## 2011-03-18 NOTE — Progress Notes (Signed)
Pt states, " I have had contractions since last night, and this morning I was 5 cm by the CNM at the [place] office. Contractions are 2.5- 3 min apart now."

## 2011-03-19 ENCOUNTER — Encounter (HOSPITAL_COMMUNITY): Payer: Self-pay | Admitting: *Deleted

## 2011-03-19 MED ORDER — LANOLIN HYDROUS EX OINT: TOPICAL_OINTMENT | CUTANEOUS | Status: DC | PRN

## 2011-03-19 MED ORDER — ONDANSETRON HCL 4 MG/2ML IJ SOLN: 4.0000 mg | INTRAMUSCULAR | Status: DC | PRN

## 2011-03-19 MED ORDER — BENZOCAINE-MENTHOL 20-0.5 % EX AERO
1.0000 "application " | INHALATION_SPRAY | CUTANEOUS | Status: DC | PRN
  Administered 2011-03-19: 1 via TOPICAL

## 2011-03-19 MED ORDER — OXYCODONE-ACETAMINOPHEN 5-325 MG PO TABS: 1.0000 | ORAL_TABLET | ORAL | Status: DC | PRN

## 2011-03-19 MED ORDER — SENNOSIDES-DOCUSATE SODIUM 8.6-50 MG PO TABS
1.0000 | ORAL_TABLET | Freq: Every day | ORAL | Status: DC
  Administered 2011-03-19: 1 via ORAL

## 2011-03-19 MED ORDER — DIPHENHYDRAMINE HCL 25 MG PO CAPS: 25.0000 mg | ORAL_CAPSULE | Freq: Four times a day (QID) | ORAL | Status: DC | PRN

## 2011-03-19 MED ORDER — PRENATAL PLUS 27-1 MG PO TABS
1.0000 | ORAL_TABLET | Freq: Every day | ORAL | Status: DC
  Administered 2011-03-19 – 2011-03-20 (×2): 1 via ORAL
  Filled 2011-03-19 (×2): qty 1

## 2011-03-19 MED ORDER — ZOLPIDEM TARTRATE 5 MG PO TABS: 5.0000 mg | ORAL_TABLET | Freq: Every evening | ORAL | Status: DC | PRN

## 2011-03-19 MED ORDER — ONDANSETRON HCL 4 MG PO TABS: 4.0000 mg | ORAL_TABLET | ORAL | Status: DC | PRN

## 2011-03-19 MED ORDER — RHO D IMMUNE GLOBULIN 1500 UNIT/2ML IJ SOLN: 300.0000 ug | Freq: Once | INTRAMUSCULAR | Status: DC

## 2011-03-19 MED ORDER — IBUPROFEN 600 MG PO TABS
600.0000 mg | ORAL_TABLET | Freq: Four times a day (QID) | ORAL | Status: DC
  Administered 2011-03-19 – 2011-03-20 (×5): 600 mg via ORAL
  Filled 2011-03-19: qty 1

## 2011-03-19 MED ORDER — TETANUS-DIPHTH-ACELL PERTUSSIS 5-2.5-18.5 LF-MCG/0.5 IM SUSP
0.5000 mL | Freq: Once | INTRAMUSCULAR | Status: DC
  Filled 2011-03-19: qty 0.5

## 2011-03-19 MED ORDER — BENZOCAINE-MENTHOL 20-0.5 % EX AERO
INHALATION_SPRAY | CUTANEOUS | Status: AC
  Administered 2011-03-19: 1 via TOPICAL
  Filled 2011-03-19: qty 56

## 2011-03-19 MED ORDER — WITCH HAZEL-GLYCERIN EX PADS: MEDICATED_PAD | CUTANEOUS | Status: DC | PRN

## 2011-03-19 MED ORDER — SIMETHICONE 80 MG PO CHEW: 80.0000 mg | CHEWABLE_TABLET | ORAL | Status: DC | PRN

## 2011-03-20 MED ORDER — IBUPROFEN 600 MG PO TABS: 600.0000 mg | ORAL_TABLET | Freq: Four times a day (QID) | ORAL | Status: AC

## 2011-03-20 NOTE — Progress Notes (Signed)
Post Partum Day 1 Subjective: no complaints, up ad lib, voiding, tolerating PO and + flatus  Objective: Blood pressure 107/73, pulse 78, temperature 97.9 F (36.6 C), temperature source Oral, resp. rate 20, height 5\' 3"  (1.6 m), weight 74.844 kg (165 lb), SpO2 97.00%, unknown if currently breastfeeding.  Physical Exam:  General: alert and no distress Lochia: appropriate Uterine Fundus: firm Incision: healing well DVT Evaluation: No evidence of DVT seen on physical exam.   Basename 03/18/11 2145  HGB 12.4  HCT 36.3    Assessment/Plan: Discharge home and Breastfeeding   LOS: 2 days   [facility] 03/20/2011, 8:16 AM

## 2011-03-20 NOTE — Progress Notes (Signed)
Mom experience breastfeeding mom. Infant feeding well per report , unable to assess latch infant just ate . Reviewed engorgement Tx. And sore nipple tx . Offfered shells and comfort gels , mom declined . Mom aware of Breastfeeding support group on Tues. At [facility] . And to call office if any concerns.

## 2011-03-20 NOTE — Discharge Summary (Signed)
Obstetric Discharge Summary Reason for Admission: onset of labor Prenatal Procedures: NST Intrapartum Procedures: spontaneous vaginal delivery Postpartum Procedures: none Complications-Operative and Postpartum: 2nd degree perineal laceration  Hemoglobin  Date Value Range Status  03/18/2011 12.4  12.0-15.0 (g/dL) Final     HCT  Date Value Range Status  03/18/2011 36.3  36.0-46.0 (%) Final    Discharge Diagnoses: Term Pregnancy-delivered  Discharge Information: Date: 03/20/2011 Activity: unrestricted and pelvic rest Diet: routine Medications: PNV and Ibuprophen Condition: stable Instructions: refer to practice specific booklet Discharge to: home Follow-up Information    Follow up with [facility] [place]. Make an appointment in 6 weeks. (If you have fever, heavy bleeding, or severe pain, come to MAU/ER  as needed if symptoms worsen)          Newborn Data: Live born  Information for the patient's newborn:  We[place], Girl Anet [[id number]]  female ; APGAR 9/9 , ; weight ;  Home with mother.  [facility] 03/20/2011, 8:26 AM

## 2011-03-25 ENCOUNTER — Ambulatory Visit (INDEPENDENT_AMBULATORY_CARE_PROVIDER_SITE_OTHER): Payer: 59 | Admitting: Family Medicine

## 2011-04-22 ENCOUNTER — Ambulatory Visit (INDEPENDENT_AMBULATORY_CARE_PROVIDER_SITE_OTHER): Payer: 59 | Admitting: Family Medicine

## 2011-04-22 ENCOUNTER — Encounter: Payer: Self-pay | Admitting: Family Medicine

## 2011-04-22 DIAGNOSIS — N61 Mastitis without abscess: Secondary | ICD-10-CM

## 2011-04-22 MED ORDER — CLINDAMYCIN HCL 300 MG PO CAPS: 300.0000 mg | ORAL_CAPSULE | Freq: Four times a day (QID) | ORAL | Status: AC

## 2011-04-22 NOTE — Progress Notes (Signed)
  Subjective:    Patient ID: [name] Green, female    DOB: 06/15/1981, 30 y.o.   MRN: [id number]  HPI  LT breast pain started lat night. Then got harder and more painful throught the night.  No fever.  Had bodyaches and HA.  Around lunch today noticed some red streak.  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   Review of Systems     Objective:   Physical Exam  Lfet outer breat with hot tender around with erythema.        Assessment & Plan:  Breast mastitis. Continue to breast feed. Amox allergy so will tx with clindamycin. Call if not improving in 48 hours. Can try the cabbage leaves in the bra. Call if fever or getting worse.

## 2011-04-22 NOTE — Patient Instructions (Signed)
Call if not getting better in 48 hours.

## 2011-05-02 ENCOUNTER — Ambulatory Visit: Payer: 59

## 2011-05-05 ENCOUNTER — Encounter: Payer: Self-pay | Admitting: *Deleted

## 2011-05-06 ENCOUNTER — Ambulatory Visit (INDEPENDENT_AMBULATORY_CARE_PROVIDER_SITE_OTHER): Payer: 59 | Admitting: Family

## 2011-05-06 NOTE — Progress Notes (Signed)
  Subjective:    Patient ID: [name] Green, female    DOB: September 03, 1981, 29 y.o.   MRN: [id number]  HPI Patient is here status post normal spontaneous vaginal delivery on 03/19/2011. Patient reports no bleeding today triply for approximately 4 weeks after delivery. Breast-feeding without difficulty. Denies issues with postpartum depression. Has not resumed sexual intercourse she had. Desires Mirena IUD for birth control method. Last Pap smear December 2011.   Review of Systems  Constitutional: Negative.   HENT: Negative.   Respiratory: Negative.   Cardiovascular: Negative.   Gastrointestinal: Negative.   Genitourinary: Negative for vaginal bleeding, vaginal discharge and vaginal pain.  Musculoskeletal: Negative.        Objective:   Physical Exam  Constitutional: She is oriented to person, place, and time. She appears well-developed and well-nourished.  HENT:  Head: Normocephalic and atraumatic.  Neck: Normal range of motion. Neck supple. No thyromegaly present.  Cardiovascular: Normal rate, regular rhythm and normal heart sounds.   Pulmonary/Chest: Effort normal and breath sounds normal. No respiratory distress.  Abdominal: Soft. Bowel sounds are normal. There is no tenderness.       Uterus involuted  Neurological: She is alert and oriented to person, place, and time.  Skin: Skin is warm and dry.          Assessment & Plan:  Normal Postpartum Exam  Plan: DC to home Reviewed exercise and nutrition Follow-up in one week for IUD insertion (Mirena); reviewed procedure, possible side effects and benefits. Advised to take ibuprofen before appointment. [facility]

## 2011-05-07 ENCOUNTER — Inpatient Hospital Stay (INDEPENDENT_AMBULATORY_CARE_PROVIDER_SITE_OTHER)
Admission: RE | Admit: 2011-05-07 | Discharge: 2011-05-07 | Disposition: A | Discharge status: Home / Self Care | Payer: 59 | Source: Ambulatory Visit | Attending: Family Medicine | Admitting: Family Medicine

## 2011-05-07 ENCOUNTER — Encounter: Payer: Self-pay | Admitting: Family Medicine

## 2011-05-07 DIAGNOSIS — N61 Mastitis without abscess: Secondary | ICD-10-CM

## 2011-05-09 ENCOUNTER — Inpatient Hospital Stay (INDEPENDENT_AMBULATORY_CARE_PROVIDER_SITE_OTHER)
Admission: RE | Admit: 2011-05-09 | Discharge: 2011-05-09 | Disposition: A | Discharge status: Home / Self Care | Payer: 59 | Source: Ambulatory Visit | Attending: Family Medicine | Admitting: Family Medicine

## 2011-05-09 ENCOUNTER — Encounter: Payer: Self-pay | Admitting: Family Medicine

## 2011-05-09 DIAGNOSIS — Z8719 Personal history of other diseases of the digestive system: Secondary | ICD-10-CM | POA: Insufficient documentation

## 2011-05-09 DIAGNOSIS — K429 Umbilical hernia without obstruction or gangrene: Secondary | ICD-10-CM

## 2011-05-09 DIAGNOSIS — Z9889 Other specified postprocedural states: Secondary | ICD-10-CM

## 2011-05-10 ENCOUNTER — Ambulatory Visit: Payer: 59 | Admitting: Obstetrics & Gynecology

## 2011-05-17 ENCOUNTER — Ambulatory Visit (INDEPENDENT_AMBULATORY_CARE_PROVIDER_SITE_OTHER): Payer: 59 | Admitting: General Surgery

## 2011-05-17 ENCOUNTER — Encounter (INDEPENDENT_AMBULATORY_CARE_PROVIDER_SITE_OTHER): Payer: Self-pay | Admitting: General Surgery

## 2011-05-17 VITALS — BP 97/64 | HR 71 | Temp 97.6°F | Ht 63.0 in | Wt 140.8 lb

## 2011-05-17 DIAGNOSIS — K429 Umbilical hernia without obstruction or gangrene: Secondary | ICD-10-CM

## 2011-05-17 NOTE — Patient Instructions (Signed)
You have a small umbilical hernia. This is the cause of your pain. It is not incarcerated. This should be repaired electively in the future. Please call when you are ready to have this done.

## 2011-05-17 NOTE — Progress Notes (Signed)
Chief Complaint  Patient presents with  . Umbilical Hernia    HPI [name] Green is a 30 y.o. female.    This woman was referred to me by Dr. Cathren Harsh at the [facility].Marland Kitchen  She was referred for evaluation of an umbilical hernia. The patient states that she has had a single episode of moderately severe umbilical pain recently.She's been exercising more lately and noticed a pain when her cat jumped on her abdomen. She states she does feel a small bulge. The pain has subsided now. She denied any fever or gastrointestinal complaints.  Significant history is that she has exercise-induced asthma. She had recent treatment for mastitis and is on clindamycin. She is 8 weeks postpartum from her second pregnancy and is breast-feeding. Has occasional migraines. She is physically active and fairly athletic. HPI  Past Medical History  Diagnosis Date  . Asthma     exercise induced, has not used inhaler in over a year    Past Surgical History  Procedure Date  . Wisdom tooth extraction 2004  . Other surgical history     cysts on throat 1992    Family History  Problem Relation Age of Onset  . Diabetes      grandparent  . Stroke      grandparent  . Heart attack      grandparent  . Lung cancer      grandparent    Social History History  Substance Use Topics  . Smoking status: Never Smoker   . Smokeless tobacco: Not on file  . Alcohol Use: No    Allergies  Allergen Reactions  . Amoxicillin     REACTION: Hives    Current Outpatient Prescriptions  Medication Sig Dispense Refill  . albuterol (PROVENTIL,VENTOLIN) 90 MCG/ACT inhaler Inhale 2 puffs into the lungs every 6 (six) hours as needed.  17 g  1  . Prenatal Vit-Fe Fumarate-FA (PRENATAL MULTIVITAMIN) 60-1 MG tablet Take 1 tablet by mouth daily.          Review of Systems Review of Systems  Constitutional: Negative.   HENT: Negative.   Eyes: Negative.   Respiratory: Negative.   Cardiovascular: Negative.    Gastrointestinal: Positive for abdominal pain. Negative for nausea, vomiting, diarrhea, constipation, blood in stool, abdominal distention, anal bleeding and rectal pain.  Genitourinary: Negative.   Musculoskeletal: Negative.   Skin: Negative.   Neurological: Negative.   Hematological: Negative.     Blood pressure 97/64, pulse 71, temperature 97.6 F (36.4 C), temperature source Oral, height 5\' 3"  (1.6 m), weight 140 lb 12 oz (63.844 kg).  Physical Exam Physical Exam  Constitutional: She is oriented to person, place, and time. She appears well-developed and well-nourished. No distress.  HENT:  Head: Normocephalic and atraumatic.  Nose: Nose normal.  Mouth/Throat: No oropharyngeal exudate.  Eyes: Conjunctivae and EOM are normal. No scleral icterus.  Neck: Normal range of motion. Neck supple. No JVD present. No tracheal deviation present. No thyromegaly present.  Cardiovascular: Normal rate, normal heart sounds and intact distal pulses.   No murmur heard. Pulmonary/Chest: Effort normal and breath sounds normal.  Abdominal: Soft. Bowel sounds are normal. She exhibits no distension and no mass. There is no tenderness. There is no rebound and no guarding.    Musculoskeletal: Normal range of motion. She exhibits no edema and no tenderness.  Neurological: She is alert and oriented to person, place, and time. She exhibits normal muscle tone. Coordination normal.  Skin: Skin is warm and  dry. No rash noted. She is not diaphoretic. No erythema. No pallor.  Psychiatric: She has a normal mood and affect. Her behavior is normal. Judgment and thought content normal.    Data Reviewed None  Assessment    Small umbilical hernia. Becoming symptomatic.  8 weeks postpartum.  Currently breast feeding.  Mild, exercise-induced asthma.    Plan    Repair of her umbilical hernia will need to be done at some point, but it is not urgent since she is asymptomatic and it is reducible.  She  states that she thinks that she would most likely have like to have this surgery done sometime before the end of this year. She will call me to schedule the surgery when she is ready.  I discussed the indications and details of umbilical hernia repair with mesh. Risks and complications have been outlined, including but not limited to bleeding, infection, recurrence of the hernia, injury to adjacent organs as the intestine was major reconstructive surgery, cardiac pulmonary and thromboembolic problems. She understands these issues well. At this time all questions are answered. She is in full agreement with this plan.  She will contact the [facility] lactation specialist prior to surgery to coordinate breast-feeding issues with them.       Haskel Dewalt M 05/17/2011, 5:09 PM

## 2011-05-18 ENCOUNTER — Encounter (INDEPENDENT_AMBULATORY_CARE_PROVIDER_SITE_OTHER): Payer: Self-pay | Admitting: General Surgery

## 2011-06-02 ENCOUNTER — Ambulatory Visit (INDEPENDENT_AMBULATORY_CARE_PROVIDER_SITE_OTHER): Payer: 59 | Admitting: Family Medicine

## 2011-06-02 DIAGNOSIS — Z23 Encounter for immunization: Secondary | ICD-10-CM

## 2011-06-02 NOTE — Progress Notes (Signed)
  Subjective:    Patient ID: [name] Green, female    DOB: 07-Jul-1981, 30 y.o.   MRN: [id number]  HPI  Here for flu shot  Review of Systems     Objective:   Physical Exam        Assessment & Plan:

## 2011-06-14 ENCOUNTER — Telehealth: Payer: Self-pay | Admitting: *Deleted

## 2011-06-14 DIAGNOSIS — Z30430 Encounter for insertion of intrauterine contraceptive device: Secondary | ICD-10-CM

## 2011-06-14 MED ORDER — MISOPROSTOL 100 MCG PO TABS: 200.0000 ug | ORAL_TABLET | Freq: Once | ORAL | Status: DC

## 2011-06-14 NOTE — Telephone Encounter (Signed)
Pt is scheduled for an IUD insertion and RX for Cytotec 200mg  #3 to take PO night before procedure sent to [facility] in [place] per VO Dr Marice Potter.

## 2011-06-24 ENCOUNTER — Encounter: Payer: Self-pay | Admitting: Advanced Practice Midwife

## 2011-06-24 ENCOUNTER — Ambulatory Visit (INDEPENDENT_AMBULATORY_CARE_PROVIDER_SITE_OTHER): Payer: 59 | Admitting: Advanced Practice Midwife

## 2011-06-24 VITALS — BP 105/62 | HR 78 | Temp 98.6°F | Resp 16 | Ht 64.0 in | Wt 138.0 lb

## 2011-06-24 DIAGNOSIS — Z3009 Encounter for other general counseling and advice on contraception: Secondary | ICD-10-CM

## 2011-06-24 DIAGNOSIS — Z30430 Encounter for insertion of intrauterine contraceptive device: Secondary | ICD-10-CM

## 2011-06-24 LAB — POCT URINE PREGNANCY: Preg Test, Ur: NEGATIVE

## 2011-06-24 MED ORDER — LEVONORGESTREL 20 MCG/24HR IU IUD
INTRAUTERINE_SYSTEM | Freq: Once | INTRAUTERINE | Status: AC
  Administered 2011-06-24: 11:00:00 via INTRAUTERINE

## 2011-06-24 NOTE — Patient Instructions (Signed)
Intrauterine Device Insertion Care After Refer to this sheet in the next few weeks. These instructions provide you with information on caring for yourself after your procedure. Your caregiver may also give you more specific instructions. Your treatment has been planned according to current medical practices, but problems sometimes occur. Call your caregiver if you have any problems or questions after your procedure. HOME CARE INSTRUCTIONS   Only take over-the-counter or prescription medicines for pain, discomfort, or fever as directed by your caregiver. Do not use aspirin. This may increase bleeding.   Check your IUD to make sure it is in place before you resume sexual activity. You should be able to feel the strings. If you cannot feel the strings, something may be wrong. The IUD may have fallen out of the uterus, or the uterus may have been punctured (perforated) during placement. Also, if the strings are getting longer, it may mean that the IUD is being forced out of the uterus. You no longer have full protection from pregnancy if any of these problems occur.   You may resume sexual intercourse if you are not having problems with the IUD. The IUD is considered immediately effective.   You may resume normal activities.   Keep all follow-up appointments to be sure your IUD has remained in place. After the first exam, yearly exams are advised, unless you cannot feel the strings of your IUD.   Continue to check that the IUD is still in place by feeling for the strings after every menstrual period.  SEEK MEDICAL CARE IF:   You have bleeding that is heavier or lasts longer than a normal menstrual cycle.   You have a fever.   You have increasing cramps or abdominal pain not relieved with medicine.   You have abdominal pain that does not seem to be related to the same area of earlier cramping and pain.   You are lightheaded, unusually weak, or faint.   You have abnormal vaginal discharge or  smells.   You have pain during sexual intercourse.   You cannot feel the IUD strings, or the IUD string has gotten longer.   You feel the IUD at the opening of the cervix in the vagina.   You think you are pregnant, or you miss your menstrual period.   The IUD string is hurting your sex partner.  Document Released: 04/20/2011 Document Revised: 05/04/2011 Document Reviewed: 04/20/2011 [employer] Patient Information 2012 [employer]. 

## 2011-06-24 NOTE — Progress Notes (Signed)
Addended by: Granville Lewis on: 06/24/2011 11:29 AM   Modules accepted: Orders

## 2011-06-24 NOTE — Progress Notes (Signed)
IUD Insertion Procedure Note  Pre-operative Diagnosis: undesired fertility  Post-operative Diagnosis: normal, Mirena insertion  Indications: contraception  Procedure Details  Urine pregnancy test was done and result was negative.  The risks (including infection, bleeding, pain, and uterine perforation) and benefits of the procedure were explained to the patient and Written informed consent was obtained.    Cervix cleansed with Betadine. Uterus sounded to 5 cm. IUD inserted without difficulty. String visible and trimmed. Patient tolerated procedure well.  IUD Information: Mirena, Lot # [id number], Expiration date 04/15.  Condition: Stable  Complications: None  Plan: RTC for IUD check in 3 months  The patient was advised to call for any fever or for prolonged or severe pain or bleeding. She was advised to use OTC ibuprofen as needed for mild to moderate pain.

## 2011-07-15 DIAGNOSIS — K429 Umbilical hernia without obstruction or gangrene: Secondary | ICD-10-CM

## 2011-07-15 HISTORY — PX: HERNIA REPAIR: SHX51

## 2011-07-18 ENCOUNTER — Telehealth (INDEPENDENT_AMBULATORY_CARE_PROVIDER_SITE_OTHER): Payer: Self-pay

## 2011-07-18 NOTE — Telephone Encounter (Signed)
Pt home doing well. Po appt made. 

## 2011-08-08 NOTE — Progress Notes (Signed)
Summary: MASTITIS right breast (room 1)   Vital Signs:  Patient Profile:   30 Years Old Female CC:      right breast pain/mastitis (had left mastitis 2 weeks ago>just finished ABX;; migraine Height:     63.2 inches Weight:      142 pounds O2 Sat:      95 % O2 treatment:    Room Air Temp:     98.3 degrees F oral Pulse rate:   96 / minute Resp:     16 per minute BP sitting:   98 / 68  (left arm) Cuff size:   regular  Pt. in pain?   yes    Location:   right breast/ headache  Vitals Entered By: Lavell Islam RN (May 07, 2011 9:57 AM)              Comments Took 3 extra strength Tylenol at 0845 for migraine and breast pain. Nursing 7 wk infant.      Current Allergies (reviewed today): ! AMOXICILLINHistory of Present Illness Chief Complaint: right breast pain/mastitis (had left mastitis 2 weeks ago>just finished ABX;; migraine History of Present Illness:  Subjective:  Patient finished a course of Keflex 5 days ago for treatment of a left mastitis.  Two days ago she began developing soreness in her right breast.  Last night she had low grade fever.  She feels well otherwise  REVIEW OF SYSTEMS Constitutional Symptoms       Complains of fever and chills.     Denies night sweats, weight loss, weight gain, and fatigue.  Eyes       Denies change in vision, eye pain, eye discharge, glasses, contact lenses, and eye surgery. Ear/Nose/Throat/Mouth       Denies hearing loss/aids, change in hearing, ear pain, ear discharge, dizziness, frequent runny nose, frequent nose bleeds, sinus problems, sore throat, hoarseness, and tooth pain or bleeding.  Respiratory       Denies dry cough, productive cough, wheezing, shortness of breath, asthma, bronchitis, and emphysema/COPD.  Cardiovascular       Denies murmurs, chest pain, and tires easily with exhertion.    Gastrointestinal       Denies stomach pain, nausea/vomiting, diarrhea, constipation, blood in bowel movements, and  indigestion. Genitourniary       Denies painful urination, kidney stones, and loss of urinary control. Neurological       Complains of headaches.      Denies paralysis, seizures, and fainting/blackouts. Musculoskeletal       Complains of redness and swelling.      Denies muscle pain, joint pain, joint stiffness, decreased range of motion, muscle weakness, and gout.      Comments: right breast Skin       Complains of unusual moles/lumps or sores.      Denies bruising and hair/skin or nail changes.      Comments: right breast Psych       Denies mood changes, temper/anger issues, anxiety/stress, speech problems, depression, and sleep problems. Other Comments: Right breast mastitis; (just finished ABX for left breast mastitis); migraine   Past History:  Past Medical History: Exercise induced asthma. Never hospitalized.  7 weeks postpartum (05-07-11); mastitis 2 weeks ago  pink eye in February this year migraines  Past Surgical History: Reviewed history from 10/29/2007 and no changes required. None  Family History: Reviewed history from 01/30/2009 and no changes required. Grandparents with DM, MI, Lung Ca, stroke mother, sister, brother alive and healthy father history of  p rostate cancer  Social History: Reviewed history from 10/17/2009 and no changes required.  Married to [employer].  Two children.  Never Smoked Alcohol use-no Drug use-no Regular exercise-yes   Objective:  Appearance:  Patient appears healthy, stated age, and in no acute distress  Eyes:  Pupils are equal, round, and reactive to light and accomodation.  Extraocular movement is intact.  Conjunctivae are not inflamed.  Lungs:  Clear to auscultation.  Breath sounds are equal.  Heart:  Regular rate and rhythm without murmurs, rubs, or gallops.  Breasts:  right breast reveals superficial erythema and mild tenderness over medial area at position 3 o'clock.  No swelling or warmth. Assessment New  Problems: MASTITIS (ICD-611.0)  SUPERFICIAL MASTITIS; DOES NOT APPEAR TO BE ABSCESS  Plan New Medications/Changes: CEPHALEXIN 500 MG TABS (CEPHALEXIN) One by mouth q6hr  #40 x 0, 05/07/2011, Donna Christen MD  New Orders: Services provided After hours-Weekends-Holidays [99051] Est. Patient Level III [40102] Planning Comments:   Continue warm compresses; Tylenol for pain. Begin Keflex 500mg  q6hr.  Follow-up with PCP if not improving 48 hours.   The patient and/or caregiver has been counseled thoroughly with regard to medications prescribed including dosage, schedule, interactions, rationale for use, and possible side effects and they verbalize understanding.  Diagnoses and expected course of recovery discussed and will return if not improved as expected or if the condition worsens. Patient and/or caregiver verbalized understanding.  Prescriptions: CEPHALEXIN 500 MG TABS (CEPHALEXIN) One by mouth q6hr  #40 x 0   Entered and Authorized by:   Donna Christen MD   Signed by:   Donna Christen MD on 05/07/2011   Method used:   Print then Give to Patient   RxID:   [id number]   Orders Added: 1)  Services provided After hours-Weekends-Holidays [99051] 2)  Est. Patient Level III [56387]

## 2011-08-08 NOTE — Progress Notes (Signed)
Summary: STOMACH PAIN,LUMP...WSE rm 4   Vital Signs:  Patient Profile:   30 Years Old Female CC:      sore spot/lump @ umbilical area x today Height:     63.2 inches Weight:      141.50 pounds O2 Sat:      99 % O2 treatment:    Room Air Temp:     98.6 degrees F oral Pulse rate:   66 / minute Resp:     16 per minute BP sitting:   108 / 70  (left arm) Cuff size:   regular  Vitals Entered By: Clemens Catholic LPN (May 09, 2011 9:55 AM)                  Updated Prior Medication List: PROAIR HFA 108 (90 BASE) MCG/ACT AERS (ALBUTEROL SULFATE) 2-4 puffs inhaled every 4-6 hours as needed * PRENATAL VITAMIN Take 1 tablet by mouth once a day CEPHALEXIN 500 MG TABS (CEPHALEXIN) One by mouth q6hr  Current Allergies (reviewed today): ! AMOXICILLINHistory of Present Illness Chief Complaint: sore spot/lump @ umbilical area x today History of Present Illness:  Subjective:  Patient complains of awakening this morning with small tender lump in left side of umbilicus.  No nausea/vomiting.  No fevers, chills, and sweats.  She states that her mastitis is better.  No history of abdominal surgery.  REVIEW OF SYSTEMS Constitutional Symptoms      Denies fever, chills, night sweats, weight loss, weight gain, and fatigue.  Eyes       Denies change in vision, eye pain, eye discharge, glasses, contact lenses, and eye surgery. Ear/Nose/Throat/Mouth       Denies hearing loss/aids, change in hearing, ear pain, ear discharge, dizziness, frequent runny nose, frequent nose bleeds, sinus problems, sore throat, hoarseness, and tooth pain or bleeding.  Respiratory       Denies dry cough, productive cough, wheezing, shortness of breath, asthma, bronchitis, and emphysema/COPD.  Cardiovascular       Denies murmurs, chest pain, and tires easily with exhertion.    Gastrointestinal       Complains of stomach pain.      Denies nausea/vomiting, diarrhea, constipation, blood in bowel movements, and  indigestion. Genitourniary       Denies painful urination, kidney stones, and loss of urinary control. Neurological       Denies paralysis, seizures, and fainting/blackouts. Musculoskeletal       Denies muscle pain, joint pain, joint stiffness, decreased range of motion, redness, swelling, muscle weakness, and gout.  Skin       Complains of unusual moles/lumps or sores.      Denies bruising and hair/skin or nail changes.  Psych       Denies mood changes, temper/anger issues, anxiety/stress, speech problems, depression, and sleep problems. Other Comments: pt c/o a sore spot/lump @ her umbilical area x today. no fever. she was treated for mastitis on 05/07/11.   Past History:  Past Medical History: Reviewed history from 05/07/2011 and no changes required. Exercise induced asthma. Never hospitalized.  7 weeks postpartum (05-07-11); mastitis 2 weeks ago  pink eye in February this year migraines  Past Surgical History: throat, 1992  Family History: Reviewed history from 01/30/2009 and no changes required. Grandparents with DM, MI, Lung Ca, stroke mother, sister, brother alive and healthy father history of p rostate cancer  Social History: Reviewed history from 05/07/2011 and no changes required.  Married to [employer].  Two children.  Never Smoked Alcohol  use-no Drug use-no Regular exercise-yes   Objective:  No acute distress  Lungs:  Clear to auscultation.  Breath sounds are equal.  Heart:  Regular rate and rhythm without murmurs, rubs, or gallops.  Abdomen:  Approximately 1cm dia tender subcutaneous nodule in umbilicus, reducible.  No erythema or warmth.  No hepatosplenomegaly.  Bowel sounds are present.  No CVA or flank tenderness.   Assessment New Problems: UMBILICAL HERNIA (ICD-553.1)  REDUCIBLE UMBILICAL HERNIA  Plan New Orders: Surgical Referral [Surgery] Est. Patient Level III [99213] Services provided After hours-Weekends-Holidays [99051] Planning  Comments:   Will refer to surgeon.  Advise earlier medical follow-up if develops increasing abdominal pain, swelling, nausea, fever, etc.   The patient and/or caregiver has been counseled thoroughly with regard to medications prescribed including dosage, schedule, interactions, rationale for use, and possible side effects and they verbalize understanding.  Diagnoses and expected course of recovery discussed and will return if not improved as expected or if the condition worsens. Patient and/or caregiver verbalized understanding.   Orders Added: 1)  Surgical Referral [Surgery] 2)  Est. Patient Level III [04540] 3)  Services provided After hours-Weekends-Holidays [99051]

## 2011-08-15 ENCOUNTER — Encounter (INDEPENDENT_AMBULATORY_CARE_PROVIDER_SITE_OTHER): Payer: Self-pay | Admitting: General Surgery

## 2011-08-15 ENCOUNTER — Ambulatory Visit (INDEPENDENT_AMBULATORY_CARE_PROVIDER_SITE_OTHER): Payer: 59 | Admitting: General Surgery

## 2011-08-15 VITALS — BP 112/66 | HR 68 | Temp 98.3°F | Resp 12 | Ht 63.0 in | Wt 131.8 lb

## 2011-08-15 DIAGNOSIS — Z9889 Other specified postprocedural states: Secondary | ICD-10-CM

## 2011-08-15 NOTE — Patient Instructions (Signed)
Your umbilical incision has healed very nicely. You may resume all normal physical activities without restriction. Stretch before and after exercise. Use good body mechanics. Return to see me if any new problems arise.

## 2011-08-15 NOTE — Progress Notes (Signed)
Subjective:     Patient ID: [name] Green, female   DOB: 02/04/1981, 30 y.o.   MRN: [id number]  HPI This patient underwent open repair of a small umbilical hernia on November 9. She feels fine. She has no wound problems. She has no pain. Review of Systems     Objective:   Physical Exam The patient looks well. She is examined standing. The infraumbilical incision is well-healed. No signs of recurrence, hematoma, or infection. Nontender.    Assessment:     Uneventful recovery following umbilical herniorrhaphy.    Plan:     Okay to resume all normal physical activities without restriction. Stretch before exercise. Using good body mechanics.  Return to see me p.r.n.

## 2011-09-14 ENCOUNTER — Ambulatory Visit (INDEPENDENT_AMBULATORY_CARE_PROVIDER_SITE_OTHER): Payer: 59 | Admitting: Obstetrics & Gynecology

## 2011-09-14 ENCOUNTER — Encounter: Payer: Self-pay | Admitting: Obstetrics & Gynecology

## 2011-09-14 VITALS — BP 108/72 | HR 79 | Temp 98.5°F | Resp 16 | Ht 63.0 in | Wt 129.0 lb

## 2011-09-14 DIAGNOSIS — Z30431 Encounter for routine checking of intrauterine contraceptive device: Secondary | ICD-10-CM

## 2011-09-14 NOTE — Progress Notes (Signed)
  Subjective:    Patient ID: [name] Green, female    DOB: 30-Aug-1981, 31 y.o.   MRN: [id number]  HPI  [name] Green is here for an IUD (Mirena) check. She has no complaints. She had some irregular vaginal bleeding for the first 2 weeks, but none since. Her husband initially felt the IUD with sex soon after it was placed but they haven't had sex in the 3 weeks since then.  Review of Systems     Objective:   Physical Exam   IUD string appears soft and is nicely curled around the cervix     Assessment & Plan:  IUD check- We both agree that she should try sex again prior to cutting the strings short (as the shortening of the strings can make removal difficult).

## 2012-03-15 ENCOUNTER — Encounter (HOSPITAL_BASED_OUTPATIENT_CLINIC_OR_DEPARTMENT_OTHER): Payer: Self-pay | Admitting: *Deleted

## 2012-03-15 ENCOUNTER — Emergency Department (HOSPITAL_BASED_OUTPATIENT_CLINIC_OR_DEPARTMENT_OTHER)
Admission: EM | Admit: 2012-03-15 | Discharge: 2012-03-15 | Disposition: A | Discharge status: Home / Self Care | Payer: 59 | Attending: Emergency Medicine | Admitting: Emergency Medicine

## 2012-03-15 DIAGNOSIS — S0100XA Unspecified open wound of scalp, initial encounter: Secondary | ICD-10-CM | POA: Insufficient documentation

## 2012-03-15 DIAGNOSIS — Z8249 Family history of ischemic heart disease and other diseases of the circulatory system: Secondary | ICD-10-CM | POA: Insufficient documentation

## 2012-03-15 DIAGNOSIS — G43909 Migraine, unspecified, not intractable, without status migrainosus: Secondary | ICD-10-CM | POA: Insufficient documentation

## 2012-03-15 DIAGNOSIS — Z833 Family history of diabetes mellitus: Secondary | ICD-10-CM | POA: Insufficient documentation

## 2012-03-15 DIAGNOSIS — IMO0002 Reserved for concepts with insufficient information to code with codable children: Secondary | ICD-10-CM

## 2012-03-15 DIAGNOSIS — Z23 Encounter for immunization: Secondary | ICD-10-CM | POA: Insufficient documentation

## 2012-03-15 DIAGNOSIS — Z809 Family history of malignant neoplasm, unspecified: Secondary | ICD-10-CM | POA: Insufficient documentation

## 2012-03-15 DIAGNOSIS — Z801 Family history of malignant neoplasm of trachea, bronchus and lung: Secondary | ICD-10-CM | POA: Insufficient documentation

## 2012-03-15 DIAGNOSIS — J45909 Unspecified asthma, uncomplicated: Secondary | ICD-10-CM | POA: Insufficient documentation

## 2012-03-15 DIAGNOSIS — Y9366 Activity, soccer: Secondary | ICD-10-CM | POA: Insufficient documentation

## 2012-03-15 DIAGNOSIS — W1801XA Striking against sports equipment with subsequent fall, initial encounter: Secondary | ICD-10-CM | POA: Insufficient documentation

## 2012-03-15 DIAGNOSIS — Z823 Family history of stroke: Secondary | ICD-10-CM | POA: Insufficient documentation

## 2012-03-15 DIAGNOSIS — Z881 Allergy status to other antibiotic agents status: Secondary | ICD-10-CM | POA: Insufficient documentation

## 2012-03-15 MED ORDER — TETANUS-DIPHTH-ACELL PERTUSSIS 5-2.5-18.5 LF-MCG/0.5 IM SUSP
0.5000 mL | Freq: Once | INTRAMUSCULAR | Status: AC
  Administered 2012-03-15: 0.5 mL via INTRAMUSCULAR
  Filled 2012-03-15: qty 0.5

## 2012-03-15 NOTE — ED Provider Notes (Signed)
History     CSN: [id number]  Arrival date & time 03/15/12  2034   First MD Initiated Contact with Patient 03/15/12 2304      Chief Complaint  Patient presents with  . Head Injury    (Consider location/radiation/quality/duration/timing/severity/associated sxs/prior treatment) HPI Pt reports she was playing soccer just prior to arrival when she fell, hitting her head on a goal post sustaining laceration to the back of her head. Denies any LOC, complaining of mild to moderate aching pain. No nausea, vomiting, confusion or ataxia.   Past Medical History  Diagnosis Date  . Asthma     exercise induced, has not used inhaler in over a year  . Migraines   . Hernia     Past Surgical History  Procedure Date  . Wisdom tooth extraction 2004  . Inguinal hernia repair     < 3 yo  . Hernia repair 07/15/11    umb hernia  . Cystectomy 1992    Family History  Problem Relation Age of Onset  . Diabetes      grandparent  . Stroke      grandparent  . Heart attack      grandparent  . Lung cancer      grandparent  . Cancer Father     prostate  . Heart disease Paternal Grandfather     History  Substance Use Topics  . Smoking status: Never Smoker   . Smokeless tobacco: Never Used  . Alcohol Use: Yes     occasionally    OB History    Grav Para Term Preterm Abortions TAB SAB Ect Mult Living   2 2 2  0 0 0 0 0 0 1      Review of Systems All other systems reviewed and are negative except as noted in HPI.    Allergies  Amoxicillin  Home Medications   Current Outpatient Rx  Name Route Sig Dispense Refill  . ALBUTEROL SULFATE HFA 108 (90 BASE) MCG/ACT IN AERS Inhalation Inhale 2 puffs into the lungs every 6 (six) hours as needed. To prevent asthma attack    . LEVONORGESTREL 20 MCG/24HR IU IUD Intrauterine 1 each by Intrauterine route once. Inserted November 2012    . PRENATAL RX 60-1 MG PO TABS Oral Take 1 tablet by mouth daily.        BP 133/83  Pulse 84  Temp 98.1 F  (36.7 C) (Oral)  Resp 18  Ht 5\' 3"  (1.6 m)  Wt 125 lb (56.7 kg)  BMI 22.14 kg/m2  SpO2 100%  Physical Exam  Nursing note and vitals reviewed. Constitutional: She is oriented to person, place, and time. She appears well-developed and well-nourished.  HENT:  Head: Normocephalic.       3cm laceration to occipital scalp  Eyes: EOM are normal. Pupils are equal, round, and reactive to light.  Neck: Normal range of motion. Neck supple.  Cardiovascular: Normal rate, normal heart sounds and intact distal pulses.   Pulmonary/Chest: Effort normal and breath sounds normal.  Abdominal: Bowel sounds are normal. She exhibits no distension. There is no tenderness.  Musculoskeletal: Normal range of motion. She exhibits no edema and no tenderness.  Neurological: She is alert and oriented to person, place, and time. She has normal strength. No cranial nerve deficit or sensory deficit.  Skin: Skin is warm and dry. No rash noted.  Psychiatric: She has a normal mood and affect.    ED Course  Procedures (including critical care time)  Labs  Reviewed - No data to display No results found.   No diagnosis found.    MDM  LACERATION REPAIR Performed by: Pollyann Savoy. Consent: Verbal consent obtained. Risks and benefits: risks, benefits and alternatives were discussed Patient identity confirmed: provided demographic data Time out performed prior to procedure  Wound explored  Laceration Location: occipital scalp  Laceration Length: 3cm  No Foreign Bodies seen or palpated  Anesthesia:none  Irrigation method: syringe Amount of cleaning: standard  Skin closure: staples  Number of sutures or staples: 2  Technique: staples  Patient tolerance: Patient tolerated the procedure well with no immediate complications.  Tetanus updated, wound care instructions given. Return for staple removal.        Yadira Hada B. Bernette Mayers, MD 03/15/12 [phone]

## 2012-03-15 NOTE — ED Notes (Signed)
Pt slammed the back of her head against the goal post approx ago. Pt has posterior scalp laceration. Bleeding controlled. Dressing in place. Denies LOC after event.

## 2012-03-22 ENCOUNTER — Ambulatory Visit (INDEPENDENT_AMBULATORY_CARE_PROVIDER_SITE_OTHER): Payer: 59 | Admitting: Family Medicine

## 2012-03-22 ENCOUNTER — Encounter: Payer: Self-pay | Admitting: Family Medicine

## 2012-03-22 VITALS — BP 125/76 | HR 83 | Ht 63.0 in | Wt 126.0 lb

## 2012-03-22 DIAGNOSIS — Z4802 Encounter for removal of sutures: Secondary | ICD-10-CM

## 2012-03-22 DIAGNOSIS — S0190XA Unspecified open wound of unspecified part of head, initial encounter: Secondary | ICD-10-CM

## 2012-03-22 DIAGNOSIS — S0191XA Laceration without foreign body of unspecified part of head, initial encounter: Secondary | ICD-10-CM

## 2012-03-22 NOTE — Progress Notes (Signed)
  Subjective:    Patient ID: [name] Green, female    DOB: May 22, 1981, 31 y.o.   MRN: [id number]  HPI Patient fell and hit a goal post playing soccer. She had 2 staples placed in the scalp. She is here today for removal. No drainge.    Review of Systems     Objective:   Physical Exam        Assessment & Plan:  Patient presents for suture removal. The wound is well healed without signs of infection.  The sutures are removed. Wound care and activity instructions given. Return prn. Cipriano Bunker MD  Subjective:  Review of Systems   Objective:     Assessment:     Plan:

## 2012-05-14 ENCOUNTER — Encounter (HOSPITAL_BASED_OUTPATIENT_CLINIC_OR_DEPARTMENT_OTHER): Payer: Self-pay | Admitting: *Deleted

## 2012-05-14 ENCOUNTER — Emergency Department (HOSPITAL_BASED_OUTPATIENT_CLINIC_OR_DEPARTMENT_OTHER)
Admission: EM | Admit: 2012-05-14 | Discharge: 2012-05-15 | Disposition: A | Discharge status: Home / Self Care | Payer: 59 | Attending: Emergency Medicine | Admitting: Emergency Medicine

## 2012-05-14 DIAGNOSIS — Y9366 Activity, soccer: Secondary | ICD-10-CM | POA: Insufficient documentation

## 2012-05-14 DIAGNOSIS — Y998 Other external cause status: Secondary | ICD-10-CM | POA: Insufficient documentation

## 2012-05-14 DIAGNOSIS — W219XXA Striking against or struck by unspecified sports equipment, initial encounter: Secondary | ICD-10-CM | POA: Insufficient documentation

## 2012-05-14 DIAGNOSIS — J45990 Exercise induced bronchospasm: Secondary | ICD-10-CM | POA: Insufficient documentation

## 2012-05-14 DIAGNOSIS — S8011XA Contusion of right lower leg, initial encounter: Secondary | ICD-10-CM

## 2012-05-14 DIAGNOSIS — S8010XA Contusion of unspecified lower leg, initial encounter: Secondary | ICD-10-CM | POA: Insufficient documentation

## 2012-05-14 NOTE — ED Notes (Addendum)
Pt c/o right calf injury, pt also c/o cold leg and foot  x 1 day ago

## 2012-05-15 ENCOUNTER — Encounter (HOSPITAL_BASED_OUTPATIENT_CLINIC_OR_DEPARTMENT_OTHER): Payer: Self-pay | Admitting: Emergency Medicine

## 2012-05-15 NOTE — Discharge Instructions (Signed)
 Contusion A contusion is a deep bruise. Contusions are the result of an injury that caused bleeding under the skin. The contusion may turn blue, purple, or yellow. Minor injuries will give you a painless contusion, but more severe contusions may stay painful and swollen for a few weeks.  CAUSES  A contusion is usually caused by a blow, trauma, or direct force to an area of the body. SYMPTOMS   Swelling and redness of the injured area.   Bruising of the injured area.   Tenderness and soreness of the injured area.   Pain.  DIAGNOSIS  The diagnosis can be made by taking a history and physical exam. An X-ray, CT scan, or MRI may be needed to determine if there were any associated injuries, such as fractures. TREATMENT  Specific treatment will depend on what area of the body was injured. In general, the best treatment for a contusion is resting, icing, elevating, and applying cold compresses to the injured area. Over-the-counter medicines may also be recommended for pain control. Ask your caregiver what the best treatment is for your contusion. HOME CARE INSTRUCTIONS   Put ice on the injured area.   Put ice in a plastic bag.   Place a towel between your skin and the bag.   Leave the ice on for 15 to 20 minutes, 3 to 4 times a day.   Only take over-the-counter or prescription medicines for pain, discomfort, or fever as directed by your caregiver. Your caregiver may recommend avoiding anti-inflammatory medicines (aspirin, ibuprofen , and naproxen) for 48 hours because these medicines may increase bruising.   Rest the injured area.   If possible, elevate the injured area to reduce swelling.  SEEK IMMEDIATE MEDICAL CARE IF:   You have increased bruising or swelling.   You have pain that is getting worse.   Your swelling or pain is not relieved with medicines.  MAKE SURE YOU:   Understand these instructions.   Will watch your condition.   Will get help right away if you are not  doing well or get worse.  Document Released: 06/01/2005 Document Revised: 08/11/2011 Document Reviewed: 06/27/2011 [facility] Patient Information 2012 [place], [place].    Watch carefully for color change to leg or toes, worsening or persistent numbness, weakness to toes or foot, pain that is progressing outside of area of local bruising, especially with movement of lower leg.  Please keep it elevated, use ice packs, ibuprofen  or aleve for inflammation and swelling.

## 2012-05-15 NOTE — ED Provider Notes (Signed)
History   This chart was scribed for [name] Green. [name] Lamas, MD by Sofie Rower. The patient was seen in room MH01/MH01 and the patient's care was started at 11:38AM.     CSN: [id number]  Arrival date & time 05/14/12  2138   First MD Initiated Contact with Patient 05/14/12 2354      Chief Complaint  Patient presents with  . Leg Injury    (Consider location/radiation/quality/duration/timing/severity/associated sxs/prior treatment) The history is provided by the patient. No language interpreter was used.    [name] Green is a 31 y.o. female who presents to the Emergency Department complaining of   sudden, progressively worsening, leg pain located at the right lower extremity,onset one day ago, with associated symptoms of swelling located at the right calf muscle and a tingling sensation located at the right foot. The pt reports she was recently playing soccer, where she was kicked in the right calf muscle. Modifying factors include walking with crutches which provides moderate relief of the right leg pain and elevation which provides moderate relief of swelling. The pt has a hx of allergy to amoxacillin. Pt also notes right lower leg felt cooler.  Bruise noted locally to area of injury.  She has not been keeping it elevated.  Takes NSAIDs.  Called RN phone and due to tingling, coolness, told to come to the ED.    The pt does not smoke, however, drinks alcohol on occasion.   PCP is Dr. Linford Arnold.    Past Medical History  Diagnosis Date  . Asthma     exercise induced, has not used inhaler in over a year  . Migraines   . Hernia     Past Surgical History  Procedure Date  . Wisdom tooth extraction 2004  . Inguinal hernia repair     < 3 yo  . Hernia repair 07/15/11    umb hernia  . Cystectomy 1992    Family History  Problem Relation Age of Onset  . Diabetes      grandparent  . Stroke      grandparent  . Heart attack      grandparent  . Lung cancer      grandparent  . Cancer  Father     prostate  . Heart disease Paternal Grandfather     History  Substance Use Topics  . Smoking status: Never Smoker   . Smokeless tobacco: Never Used  . Alcohol Use: Yes     occasionally    OB History    Grav Para Term Preterm Abortions TAB SAB Ect Mult Living   2 2 2  0 0 0 0 0 0 1      Review of Systems  Constitutional: Negative for fever.  Respiratory: Negative for cough and shortness of breath.   Cardiovascular: Positive for leg swelling. Negative for chest pain.  Musculoskeletal: Positive for arthralgias. Negative for joint swelling.  Skin: Positive for color change and wound. Negative for rash.  Neurological: Positive for numbness. Negative for weakness.  Hematological: Does not bruise/bleed easily.    Allergies  Amoxicillin  Home Medications   Current Outpatient Rx  Name Route Sig Dispense Refill  . ALBUTEROL SULFATE HFA 108 (90 BASE) MCG/ACT IN AERS Inhalation Inhale 2 puffs into the lungs every 6 (six) hours as needed. To prevent asthma attack    . LEVONORGESTREL 20 MCG/24HR IU IUD Intrauterine 1 each by Intrauterine route once. Inserted November 2012    . PRENATAL RX 60-1 MG PO TABS Oral  Take 1 tablet by mouth daily.        BP 130/79  Pulse 88  Temp 98.1 F (36.7 C)  Resp 16  Ht 5\' 3"  (1.6 m)  Wt 125 lb (56.7 kg)  BMI 22.14 kg/m2  SpO2 98%  Physical Exam  Nursing note and vitals reviewed. Constitutional: She is oriented to person, place, and time. She appears well-developed and well-nourished. No distress.  Cardiovascular: Normal rate.   Pulmonary/Chest: Effort normal. No respiratory distress.  Musculoskeletal: She exhibits edema. She exhibits no tenderness.       Legs:      2 + DP and 2 + PT. Capillary refill at 2 seconds of toes. 1/2 dollar size bruise located at medial calf on right, non tender distal or proximal to the injury.  Compartments are soft. Some passive pain with ROM of ankle, but only with dorsiflexion likely due to stretching  of gastroc muscle.  No sig pain with plantar flexion  Neurological: She is alert and oriented to person, place, and time. She has normal strength. No sensory deficit.  Skin: Skin is warm and dry. No rash noted. She is not diaphoretic. No pallor.    ED Course  Procedures (including critical care time)  DIAGNOSTIC STUDIES: Oxygen Saturation is 98% on room air, normal by my interpretation.    COORDINATION OF CARE:    12:14AM- Hematoma, management of swelling, pain management and compartment syndrome discussed. Pt agrees with treatment.   Labs Reviewed - No data to display No results found.   1. Contusion of lower leg, right       MDM  I personally performed the services described in this documentation, which was scribed in my presence. The recorded information has been reviewed and considered.   Some minimal subj paresthesias, temporary.  No signs of compartment syndrome.  Pt counseled on signs and symptoms and advised to return if symptoms worsen.  Likely contusion, underlying hematoma.  Recommended to continue ice, elevation, mild compression NSAIDs continued.     [name] Green. [name] Lamas, MD 05/15/12 [phone]

## 2012-05-18 ENCOUNTER — Encounter: Payer: Self-pay | Admitting: Sports Medicine

## 2012-05-18 ENCOUNTER — Ambulatory Visit (INDEPENDENT_AMBULATORY_CARE_PROVIDER_SITE_OTHER): Payer: 59 | Admitting: Sports Medicine

## 2012-05-18 VITALS — BP 113/66 | HR 73 | Temp 97.9°F | Wt 125.0 lb

## 2012-05-18 DIAGNOSIS — M79669 Pain in unspecified lower leg: Secondary | ICD-10-CM | POA: Insufficient documentation

## 2012-05-18 DIAGNOSIS — M79609 Pain in unspecified limb: Secondary | ICD-10-CM

## 2012-05-18 NOTE — Progress Notes (Signed)
Patient ID: [name] Green, female   DOB: [birth year], 31 y.o.   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 She developed significant pain, swelling, and bruising over the medial head of the gastroc, and felt as though she was unable to lift her right foot. She went to the ED with concerns for a compartment syndrome. They sent her home without any further intervention. Her pain is localized in the posterior medial aspect of her calf. She is able to ambulate, and she does have good sensation over the dorsum and plantar aspects of her foot. The pain does not radiate.  Past medical history, Surgical history, Family history, Social history, Allergies, and medications have been entered into the medical record, reviewed, and no changes needed.   Review of Systems: No fevers, chills, night sweats, weight loss, chest pain, or shortness of breath.   Objective:    General: Well Developed, well nourished, and in no acute distress.  Neuro: Alert and oriented x3, extra-ocular muscles intact.  HEENT: Normocephalic, atraumatic, pupils equal round reactive to light, neck supple, no masses, no lymphadenopathy, thyroid nonpalpable.  Skin: Warm and dry, no rashes. Cardiac: Regular rate and rhythm, no murmurs rubs or gallops.  Respiratory: Clear to auscultation bilaterally. Not using accessory muscles, speaking in full sentences. Right Ankle: No visible erythema or swelling. Range of motion is full in all directions. Strength is 5/5 in all directions. Stable lateral and medial ligaments; squeeze test and kleiger test unremarkable; Talar dome nontender; No pain at base of 5th MT; No tenderness over cuboid; No tenderness over N spot or navicular prominence No tenderness on posterior aspects of lateral and medial malleolus No sign of peroneal tendon subluxations or  tenderness to palpation Negative tarsal tunnel tinel's Able to walk 4 steps. There is exquisite tenderness to palpation over a bruised area over the medial head of her gastroc. She has excellent strength to dorsiflexion and excellent sensation over her entire foot.  Procedure: Limited ultrasound of right calf Device: GE logiq E. Findings: There is any heterogenous hematoma located within the right medial head of the gastrocnemius. She also has excellent flow in her dorsalis pedis, as well as posterior tibial arteries.  Impression: Hematoma right medial head of the gastrocnemius.  Images permanently stored in the unit and are available for review.  Impression and Recommendations:

## 2012-05-18 NOTE — Assessment & Plan Note (Addendum)
No suspicion of traumatic compartment syndrome. Ibuprofen 800 mg 3 times a day. Ultrasound shows large hematoma in the gastroc medial head. Good doppler flow in dorsalis pedis and posterior tibial artery. Ace wrap. Heel lift to be placed in shoe. Rehabilitation to be started next week. RTC 2 weeks.

## 2012-05-28 ENCOUNTER — Ambulatory Visit (INDEPENDENT_AMBULATORY_CARE_PROVIDER_SITE_OTHER): Payer: 59 | Admitting: Sports Medicine

## 2012-05-28 ENCOUNTER — Encounter: Payer: Self-pay | Admitting: Sports Medicine

## 2012-05-28 VITALS — BP 103/70 | HR 81 | Ht 63.0 in | Wt 126.0 lb

## 2012-05-28 DIAGNOSIS — M79609 Pain in unspecified limb: Secondary | ICD-10-CM

## 2012-05-28 DIAGNOSIS — M79669 Pain in unspecified lower leg: Secondary | ICD-10-CM

## 2012-05-28 NOTE — Assessment & Plan Note (Signed)
Resolved. May continue to play soccer. I think she should keep the heel lift in while playing. She can come back to see me on an as-needed basis.

## 2012-05-28 NOTE — Progress Notes (Signed)
Subjective:    CC: Followup right calf strain  HPI: [name] Green comes back to see me approximately 10 days after her last visit. Approximately 2 weeks ago she strained her right calf playing soccer. We saw a moderately-sized tear in the medial head of her gastrocnemius at the musculotendinous junction. I placed her in a heel lift, compression for her calf, home rehabilitation exercises, and took a lot of soccer. Today she returns essentially 90+ percent better  Past medical history, Surgical history, Family history, Social history, Allergies, and medications have been entered into the medical record, reviewed, and no changes needed.   Review of Systems: No fevers, chills, night sweats, weight loss, chest pain, or shortness of breath.   Objective:    General: Well Developed, well nourished, and in no acute distress.  Neuro: Alert and oriented x3, extra-ocular muscles intact.  HEENT: Normocephalic, atraumatic, pupils equal round reactive to light, neck supple, no masses, no lymphadenopathy, thyroid nonpalpable.  Skin: Warm and dry, no rashes. Cardiac: Regular rate and rhythm, no murmurs rubs or gallops.  Respiratory: Clear to auscultation bilaterally. Not using accessory muscles, speaking in full sentences. Right Ankle: No visible erythema or swelling. Range of motion is full in all directions. Strength is 5/5 in all directions. Stable lateral and medial ligaments; squeeze test and kleiger test unremarkable; Talar dome nontender; No pain at base of 5th MT; No tenderness over cuboid; No tenderness over N spot or navicular prominence No tenderness on posterior aspects of lateral and medial malleolus No sign of peroneal tendon subluxations or tenderness to palpation Negative tarsal tunnel tinel's Able to walk 4 steps.  Small bruise with a small nodule noted at previous site of care at the right medial head of the gastroc musculotendinous junction. She has excellent strength. She is able to  jump up and down on the affected leg.  Musculoskeletal ultrasound done, I will not charge for this. It shows that the split has healed.  Impression and Recommendations:

## 2012-09-25 ENCOUNTER — Ambulatory Visit (INDEPENDENT_AMBULATORY_CARE_PROVIDER_SITE_OTHER): Payer: 59 | Admitting: Obstetrics & Gynecology

## 2012-09-25 ENCOUNTER — Encounter: Payer: Self-pay | Admitting: Obstetrics & Gynecology

## 2012-09-25 VITALS — BP 137/86 | HR 86 | Temp 98.6°F | Resp 16 | Ht 63.0 in | Wt 137.0 lb

## 2012-09-25 DIAGNOSIS — Z1151 Encounter for screening for human papillomavirus (HPV): Secondary | ICD-10-CM

## 2012-09-25 DIAGNOSIS — Z01419 Encounter for gynecological examination (general) (routine) without abnormal findings: Secondary | ICD-10-CM

## 2012-09-25 DIAGNOSIS — Z124 Encounter for screening for malignant neoplasm of cervix: Secondary | ICD-10-CM

## 2012-09-25 NOTE — Addendum Note (Signed)
Addended by: Granville Lewis on: 09/25/2012 03:17 PM   Modules accepted: Orders

## 2012-09-25 NOTE — Progress Notes (Signed)
  Subjective:     [name] Green is a 32 y.o. female here for a routine exam.  Current complaints: none.  Considering pregnancy this summer.  Personal health questionnaire reviewed: yes.   Gynecologic History No LMP recorded. Patient is not currently having periods (Reason: IUD). Contraception: IUD Last Pap: 2011. Results were: normal Last mammogram: n/a. Results were: n/a  Obstetric History OB History    Grav Para Term Preterm Abortions TAB SAB Ect Mult Living   2 2 2  0 0 0 0 0 0 1     # Outc Date GA Lbr Len/2nd Wgt Sex Del Anes PTL Lv   1 TRM 9/10 [redacted]w[redacted]d  7lb8oz(3.402kg) F SVD EPI No Yes   Comments: Vacuum delivery   2 TRM 7/12 [redacted]w[redacted]d 00:00  F           The following portions of the patient's history were reviewed and updated as appropriate: allergies, current medications, past family history, past medical history, past social history, past surgical history and problem list.  Review of Systems A comprehensive review of systems was negative.    Objective:   Filed Vitals:   09/25/12 1432  BP: 137/86  Pulse: 86  Temp: 98.6 F (37 C)  TempSrc: Oral  Resp: 16  Height: 5\' 3"  (1.6 m)  Weight: 137 lb (62.143 kg)     Vitals:  WNL General appearance: alert, cooperative and no distress Head: Normocephalic, without obvious abnormality, atraumatic Eyes: negative Throat: lips, mucosa, and tongue normal; teeth and gums normal Lungs: clear to auscultation bilaterally Breasts: normal appearance, no masses or tenderness, No nipple retraction or dimpling, No nipple discharge or bleeding Heart: regular rate and rhythm Abdomen: soft, non-tender; bowel sounds normal; no masses,  no organomegaly Pelvic: cervix normal in appearance, external genitalia normal, no adnexal masses or tenderness, no bladder tenderness, no cervical motion tenderness, perianal skin: no external genital warts noted, urethra without abnormality or discharge, uterus normal size, shape, and consistency and  vagina normal without discharge Extremities: no edema, redness or tenderness in the calves or thighs Skin: no lesions or rash Lymph nodes: Axillary adenopathy: none       Assessment:    Healthy female exam.  Plan:    Education reviewed: self breast exams. Contraception: IUD. Follow up in: 1 year. start prenatal vitamins one month before trying to conceive

## 2012-12-31 ENCOUNTER — Encounter: Payer: Self-pay | Admitting: Physician Assistant

## 2012-12-31 ENCOUNTER — Ambulatory Visit (INDEPENDENT_AMBULATORY_CARE_PROVIDER_SITE_OTHER): Payer: 59 | Admitting: Physician Assistant

## 2012-12-31 ENCOUNTER — Ambulatory Visit (HOSPITAL_COMMUNITY): Payer: 59

## 2012-12-31 VITALS — BP 119/72 | HR 77 | Wt 134.0 lb

## 2012-12-31 DIAGNOSIS — M25569 Pain in unspecified knee: Secondary | ICD-10-CM

## 2012-12-31 DIAGNOSIS — S8991XA Unspecified injury of right lower leg, initial encounter: Secondary | ICD-10-CM

## 2012-12-31 DIAGNOSIS — S8990XA Unspecified injury of unspecified lower leg, initial encounter: Secondary | ICD-10-CM

## 2012-12-31 DIAGNOSIS — M25561 Pain in right knee: Secondary | ICD-10-CM

## 2012-12-31 MED ORDER — MELOXICAM 15 MG PO TABS: 15.0000 mg | ORAL_TABLET | Freq: Every day | ORAL | Status: DC

## 2012-12-31 NOTE — Patient Instructions (Addendum)
Ice at a time. Take mobic daily. NOn weight bearing for 1 week then as tolerated. Follow up in 4 weeks.   Meniscus Tear with Phase I Rehab The meniscus is a C-shaped cartilage structure, located in the knee joint between the thigh bone (femur) and the shinbone (tibia). Two menisci are located in each knee joint: the inner and outer meniscus. The meniscus acts as an adapter between the thigh bone and shinbone, allowing them to fit properly together. It also functions as a shock absorber, to reduce the stress placed on the knee joint and to help supply nutrients to the knee joint cartilage. As people age, the meniscus begins to harden and become more vulnerable to injury. Meniscus tears are a common injury, especially in older athletes. Inner meniscus tears are more common than outer meniscus tears.  SYMPTOMS   Pain in the knee, especially with standing or squatting with the affected leg.  Tenderness along the joint line.  Swelling in the knee joint (effusion), usually starting 1 to 2 days after injury.  Locking or catching of the knee joint, causing inability to straighten the knee completely.  Giving way or buckling of the knee. CAUSES  A meniscus tear occurs when a force is placed on the meniscus that is greater than it can handle. Common causes of injury include:  Direct hit (trauma) to the knee.  Twisting, pivoting, or cutting (rapidly changing direction while running), kneeling or squatting.  Without injury, due to aging. RISK INCREASES WITH:  Contact sports (football, rugby).  Sports in which cleats are used with pivoting (soccer, lacrosse) or sports in which good shoe grip and sudden change in direction are required (racquetball, basketball, squash).  Previous knee injury.  Associated knee injury, particularly ligament injuries.  Poor strength and flexibility. PREVENTION  Warm up and stretch properly before activity.  Maintain physical fitness:  Strength,  flexibility, and endurance.  Cardiovascular fitness.  Protect the knee with a brace or elastic bandage.  Wear properly fitted protective equipment (proper cleats for the surface). PROGNOSIS  Sometimes, meniscus tears heal on their own. However, definitive treatment requires surgery, followed by at least 6 weeks of recovery.  RELATED COMPLICATIONS   Recurring symptoms that result in a chronic problem.  Repeated knee injury, especially if sports are resumed too soon after injury or surgery.  Progression of the tear (the tear gets larger), if untreated.  Arthritis of the knee in later years (with or without surgery).  Complications of surgery, including infection, bleeding, injury to nerves (numbness, weakness, paralysis) continued pain, giving way, locking, nonhealing of meniscus (if repaired), need for further surgery, and knee stiffness (loss of motion). TREATMENT  Treatment first involves the use of ice and medicine, to reduce pain and inflammation. You may find using crutches to walk more comfortable. However, it is okay to bear weight on the injured knee, if the pain will allow it. Surgery is often advised as a definitive treatment. Surgery is performed through an incision near the joint (arthroscopically). The torn piece of the meniscus is removed, and if possible the joint cartilage is repaired. After surgery, the joint must be restrained. After restraint, it is important to perform strengthening and stretching exercises to help regain strength and a full range of motion. These exercises may be completed at home or with a therapist.  MEDICATION  If pain medicine is needed, nonsteroidal anti-inflammatory medicines (aspirin and ibuprofen), or other minor pain relievers (acetaminophen), are often advised.  Do not take pain medicine  for 7 days before surgery.  Prescription pain relievers may be given, if your caregiver thinks they are needed. Use only as directed and only as much as you  need. HEAT AND COLD  Cold treatment (icing) should be applied for 10 to 15 minutes every 2 to 3 hours for inflammation and pain, and immediately after activity that aggravates your symptoms. Use ice packs or an ice massage.  Heat treatment may be used before performing stretching and strengthening activities prescribed by your caregiver, physical therapist, or athletic trainer. Use a heat pack or a warm water soak. SEEK MEDICAL CARE IF:   Symptoms get worse or do not improve in 2 weeks, despite treatment.  New, unexplained symptoms develop. (Drugs used in treatment may produce side effects.) EXERCISES RANGE OF MOTION (ROM) AND STRETCHING EXERCISES - Meniscus Tear, Non-operative, Phase I These are some of the initial exercises with which you may start your rehabilitation program, until you see your caregiver again or until your symptoms are resolved. Remember:   These initial exercises are intended to be gentle. They will help you restore motion without increasing any swelling.  Completing these exercises allows less painful movement and prepares you for the more aggressive strengthening exercises in Phase II.  An effective stretch should be held for at least 30 seconds.  A stretch should never be painful. You should only feel a gentle lengthening or release in the stretched tissue. RANGE OF MOTION - Knee Flexion, Active  Lie on your back with both knees straight. (If this causes back discomfort, bend your healthy knee, placing your foot flat on the floor.)  Slowly slide your heel back toward your buttocks until you feel a gentle stretch in the front of your knee or thigh.  Hold for __________ seconds. Slowly slide your heel back to the starting position. Repeat __________ times. Complete this exercise __________ times per day.  RANGE OF MOTION - Knee Flexion and Extension, Active-Assisted  Sit on the edge of a table or chair with your thighs firmly supported. It may be helpful to  place a folded towel under the end of your right / left thigh.  Flexion (bending): Place the ankle of your healthy leg on top of the other ankle. Use your healthy leg to gently bend your right / left knee until you feel a mild tension across the top of your knee.  Hold for __________ seconds.  Extension (straightening): Switch your ankles so your right / left leg is on top. Use your healthy leg to straighten your right / left knee until you feel a mild tension on the backside of your knee.  Hold for __________ seconds. Repeat __________ times. Complete __________ times per day. STRETCH - Knee Flexion, Supine  Lie on the floor with your right / left heel and foot lightly touching the wall. (Place both feet on the wall if you do not use a door frame.)  Without using any effort, allow gravity to slide your foot down the wall slowly until you feel a gentle stretch in the front of your right / left knee.  Hold this stretch for __________ seconds. Then return the leg to the starting position, using your healthy leg for help, if needed. Repeat __________ times. Complete this stretch __________ times per day.  STRETCH - Knee Extension Sitting  Sit with your right / left leg/heel propped on another chair, coffee table, or foot stool.  Allow your leg muscles to relax, letting gravity straighten out your knee.*  You should feel a stretch behind your right / left knee. Hold this position for __________ seconds. Repeat __________ times. Complete this stretch __________ times per day.  *Your physician, physical therapist or athletic trainer may instruct you place a __________ weight on your thigh, just above your kneecap, to deepen the stretch.  STRENGTHENING EXERCISES - Meniscus Tear, Non-operative, Phase I These exercises may help you when beginning to rehabilitate your injury. They may resolve your symptoms with or without further involvement from your physician, physical therapist or athletic  trainer. While completing these exercises, remember:   Muscles can gain both the endurance and the strength needed for everyday activities through controlled exercises.  Complete these exercises as instructed by your physician, physical therapist or athletic trainer. Progress the resistance and repetitions only as guided. STRENGTH - Quadriceps, Isometrics  Lie on your back with your right / left leg extended and your opposite knee bent.  Gradually tense the muscles in the front of your right / left thigh. You should see either your knee cap slide up toward your hip or increased dimpling just above the knee. This motion will push the back of the knee down toward the floor, mat, or bed on which you are lying.  Hold the muscle as tight as you can, without increasing your pain, for __________ seconds.  Relax the muscles slowly and completely between each repetition. Repeat __________ times. Complete this exercise __________ times per day.  STRENGTH - Quadriceps, Short Arcs   Lie on your back. Place a __________ inch towel roll under your right / left knee, so that the knee bends slightly.  Raise only your lower leg by tightening the muscles in the front of your thigh. Do not allow your thigh to rise.  Hold this position for __________ seconds. Repeat __________ times. Complete this exercise __________ times per day.  OPTIONAL ANKLE WEIGHTS: Begin with ____________________, but DO NOT exceed ____________________. Increase in 1 pound/0.5 kilogram increments. STRENGTH - Quadriceps, Straight Leg Raises  Quality counts! Watch for signs that the quadriceps muscle is working, to be sure you are strengthening the correct muscles and not "cheating" by substituting with healthier muscles.  Lay on your back with your right / left leg extended and your opposite knee bent.  Tense the muscles in the front of your right / left thigh. You should see either your knee cap slide up or increased dimpling just  above the knee. Your thigh may even shake a bit.  Tighten these muscles even more and raise your leg 4 to 6 inches off the floor. Hold for __________ seconds.  Keeping these muscles tense, lower your leg.  Relax the muscles slowly and completely in between each repetition. Repeat __________ times. Complete this exercise __________ times per day.  STRENGTH - Hamstring, Curls   Lay on your stomach with your legs extended. (If you lay on a bed, your feet may hang over the edge.)  Tighten the muscles in the back of your thigh to bend your right / left knee up to 90 degrees. Keep your hips flat on the bed.  Hold this position for __________ seconds.  Slowly lower your leg back to the starting position. Repeat __________ times. Complete this exercise __________ times per day.  STRENGTH  Quadriceps, Squats  Stand in a door frame so that your feet and knees are in line with the frame.  Use your hands for balance, not support, on the frame.  Slowly lower your weight, bending at the  hips and knees. Keep your lower legs upright so that they are parallel with the door frame. Squat only within the range that does not increase your knee pain. Never let your hips drop below your knees.  Slowly return upright, pushing with your legs, not pulling with your hands. Repeat __________ times. Complete this exercise __________ times per day.  STRENGTH - Quad/VMO, Isometric   Sit in a chair with your right / left knee slightly bent. With your fingertips, feel the VMO muscle just above the inside of your knee. The VMO is important in controlling the position of your kneecap.  Keeping your fingertips on this muscle. Without actually moving your leg, attempt to drive your knee down as if straightening your leg. You should feel your VMO tense. If you have a difficult time, you may wish to try the same exercise on your healthy knee first.  Tense this muscle as hard as you can without increasing any knee  pain.  Hold for __________ seconds. Relax the muscles slowly and completely in between each repetition. Repeat __________ times. Complete exercise __________ times per day.  Document Released: 09/05/1998 Document Revised: 11/14/2011 Document Reviewed: 12/04/2008 [facility] Patient Information 2013 [place], [place]. Meniscus Tear with Phase II Rehab The meniscus is a C-shaped cartilage structure, located in the knee joint between the thigh bone (femur) and the shinbone (tibia). Two menisci are located in each knee joint: the inner and outer meniscus. The meniscus acts as an adapter between the thigh bone and shinbone, allowing them to fit properly together. It also functions as a shock absorber, to reduce the stress placed on the knee joint and to help supply nutrients to the knee joint cartilage. As people age, the meniscus begins to harden and become more vulnerable to injury. Meniscus tears are a common injury, especially in older athletes. Inner meniscus tears are more common than outer meniscus tears.  SYMPTOMS   Pain in the knee, especially with standing or squatting with the affected leg.  Tenderness along the joint line.  Swelling in the knee joint (effusion), usually starting 1 to 2 days after injury.  Locking or catching of the knee joint, causing inability to straighten the knee completely.  Giving way or buckling of the knee. CAUSES  A meniscus tear occurs when a force is placed on the meniscus that is greater than it can handle. Common causes of injury include:  Direct hit (trauma) to the knee.  Twisting, pivoting, or cutting (rapidly changing direction while running), kneeling or squatting.  Without injury, due to aging. RISK INCREASES WITH:  Contact sports (football, rugby).  Sports in which cleats are used with pivoting (soccer, lacrosse) or sports in which good shoe grip and sudden change in direction are required (racquetball, basketball, squash).  Previous knee  injury.  Associated knee injury, particularly ligament injuries.  Poor strength and flexibility. PREVENTION  Warm up and stretch properly before activity.  Maintain physical fitness:  Strength, flexibility, and endurance.  Cardiovascular fitness.  Protect the knee with a brace or elastic bandage.  Wear properly fitted protective equipment (proper cleats for the surface). PROGNOSIS  Sometimes, meniscus tears heal on their own. However, definitive treatment requires surgery, followed by at least 6 weeks of recovery.  RELATED COMPLICATIONS   Recurring symptoms that result in a chronic problem.  Repeated knee injury, especially if sports are resumed too soon after injury or surgery.  Progression of the tear (the tear gets larger), if untreated.  Arthritis of the knee in  later years (with or without surgery).  Complications of surgery, including infection, bleeding, injury to nerves (numbness, weakness, paralysis) continued pain, giving way, locking, nonhealing of meniscus (if repaired), need for further surgery, and knee stiffness (loss of motion). TREATMENT  Treatment first involves the use of ice and medicine, to reduce pain and inflammation. You may find using crutches to walk more comfortable. However, it is okay to bear weight on the injured knee, if the pain will allow it. Surgery is often advised as a definitive treatment. Surgery is performed through an incision near the joint (arthroscopically). The torn piece of the meniscus is removed, and if possible the joint cartilage is repaired. After surgery, the joint must be restrained. After restraint, it is important to perform strengthening and stretching exercises to help regain strength and a full range of motion. These exercises may be completed at home or with a therapist.  MEDICATION   If pain medicine is needed, nonsteroidal anti-inflammatory medicines (aspirin and ibuprofen), or other minor pain relievers (acetaminophen),  are often advised.  Do not take pain medicine for 7 days before surgery.  Prescription pain relievers may be given, if your caregiver thinks they are needed. Use only as directed and only as much as you need. HEAT AND COLD:  Cold treatment (icing) should be applied for 10 to 15 minutes every 2 to 3 hours for inflammation and pain, and immediately after activity that aggravates your symptoms. Use ice packs or an ice massage.  Heat treatment may be used before performing stretching and strengthening activities prescribed by your caregiver, physical therapist, or athletic trainer. Use a heat pack or a warm water soak. SEEK MEDICAL CARE IF:  Symptoms get worse or do not improve in 2 weeks, despite treatment.  New, unexplained symptoms develop. (Drugs used in treatment may produce side effects.) EXERCISES RANGE OF MOTION (ROM) AND STRETCHING EXERCISES - Meniscus Tear, Non-operative Phase II After your physician, physical therapist or athletic trainer feels your knee has made progress significant enough to begin more advanced exercises, he or she may recommend some of the exercises that follow. He or she may also advise you to continue with the exercises which you completed in Phase I of your rehabilitation. While completing these exercises, remember:   Restoring tissue flexibility helps normal motion to return to the joints. This allows healthier, less painful movement and activity.  An effective stretch should be held for at least 30 seconds.  A stretch should never be painful. You should only feel a gentle lengthening or release in the stretched tissue. STRETCH - Quadriceps, Prone   Lie on your stomach on a firm surface, such as a bed or padded floor.  Bend your right / left knee and grasp your ankle. If you are unable to reach your ankle or pant leg, use a belt around your foot to lengthen your reach.  Gently pull your heel toward your buttocks. Your knee should not slide out to the side.  You should feel a stretch in the front of your thigh and knee.  Hold this position for __________ seconds. Repeat __________ times. Complete this stretch __________ times per day.  STRETCH - Knee Extension, Prone  Lie on your stomach on a firm surface, such as a bed or countertop. Place your right / left knee and leg just beyond the edge of the surface. You may wish to place a towel under the far end of your right / left thigh for comfort.  Relax your leg muscles  and allow gravity to straighten your knee. Your caregiver may advise you to add an ankle weight, if more resistance is helpful for you.  You should feel a stretch in the back of your right / left knee. Hold this position for __________ seconds. Repeat __________ times. Complete this __________ times per day. STRENGTHENING EXERCISES - Meniscus Tear Phase II These are some of the exercises you may progress to in your rehabilitation program. It is critical that you follow the instructions of your caregiver. Based on your individual needs, your caregiver may choose a more or less aggressive approach than the exercises presented. Remember:   Strong muscles with good endurance tolerate stress better.  Do the exercises as initially prescribed by your caregiver. Progress slowly with each exercise, gradually increasing the number of repetitions and weight used under his or her guidance. STRENGTH - Quadriceps, Short Arcs   Lie on your back. Place a __________ inch towel roll under your right / left knee, so that the knee bends slightly.  Raise only your lower leg by tightening the muscles in the front of your thigh. Do not allow your thigh to rise.  Hold this position for __________ seconds. Repeat __________ times. Complete this exercise __________ times per day.  OPTIONAL ANKLE WEIGHTS: Begin with ____________________, but DO NOT exceed ____________________. Increase in 1 pound/0.5 kilogram increments. STRENGTH - Quadriceps, Step-Ups    Use a thick book, step or step stool that is __________ inches tall.  Hold a wall or counter for balance only, not support.  Slowly step up with your right / left foot, keeping your knee in line with your hip and foot. Do not allow your knee to bend so far that you cannot see your toes.  Slowly unlock your knee and lower yourself to the starting position. Your muscles, not gravity, should lower you. Repeat __________ times. Complete this exercise __________ times per day.  STRENGTH - Quadriceps, Wall Slides  Follow guidelines for form closely. Increased knee pain often results from poorly placed feet or knees.  Lean against a smooth wall or door and walk your feet out 18-24 inches. Place your feet hip width apart.  Slowly slide down the wall or door until your knees bend __________ degrees.* Keep your knees over your heels, not your toes, and in line with your hips, not falling to either side.  Hold for __________ seconds. Stand up to rest for __________ seconds in between each repetition. Repeat __________ times. Complete this exercise __________ times per day. * Your physician, physical therapist or athletic trainer will alter this angle based on your symptoms and progress. STRENGTH - Hamstring, Curls  Lay on your stomach with your legs extended. (If you lay on a bed, your feet may hang over the edge.)  Tighten the muscles in the back of your thigh to bend your right / left knee up to 90 degrees. Keep your hips flat on the bed.  Hold this position for __________ seconds.  Slowly lower your leg back to the starting position. Repeat __________ times. Complete this exercise __________ times per day.  OPTIONAL ANKLE WEIGHTS: Begin with ____________________, but DO NOT exceed ____________________. Increase in 1 pound/0.5 kilogram increments. Document Released: 12/13/2005 Document Revised: 11/14/2011 Document Reviewed: 12/04/2008 [facility] Patient Information 2013 [place], [place].

## 2012-12-31 NOTE — Progress Notes (Signed)
  Subjective:    Patient ID: [name] Green, female    DOB: 27-Mar-1981, 32 y.o.   MRN: [id number]  HPI Patient presents to the clinic with right knee pain that started yesterday during a soccer game where she twisted her knee and fell to the ground. She does not remember exactly what happen but remembers hearing a loud pop and felt a pull in her lateral right knee. She has not been able to weight bear since. Pain is 7/10 with movement. Using crutches today. Taking ibuprofen for pain and does help some with pain.    Review of Systems     Objective:   Physical Exam  Constitutional: She appears well-developed and well-nourished.  Musculoskeletal:  Right knee with several abrasions on the knee cap along with signifcant swelling. No joint tenderness to palpation. Tenderness to palpation where the quadriceps join to the knee. Anterior drawer was negative and no laxity was felt. Mcmurrays was positive with more pain lateral knee and a click heard with movement. Not able to bear full weight. Strength decreased compared to left 3/5. ROM is limited with flexion and extension.   Psychiatric: She has a normal mood and affect. Her behavior is normal.          Assessment & Plan:  Right knee injury/pain- Reassured pt not presenting like ACL/MCL/LCL/PCL tear. There could be some strain to ligaments but no complete tears due to stability of knee. Presenting like there was some lateral mensicus injury. Will get xray to rule out fracture. X-ray was negative for fracture. Put in knee immobilizer and told her to remain on crutches for 1 week or until tolerated weigh bearing with brace. Gave exercises to do daily has tolerated for phase 1 for 2 weeks and then phase 2 for 2 weeks. mobic was given for pain. If not improving could call office. If need something a little stronger for pain can also call and could consider tramadol. Reassured pt that if meniscal damage was done most will self repair with proper PT  and time. Knee injections can also help. Will consider knee injections if not improving. Follow up in 4 weeks. No running, soccer, jumping, twisting motions. After 2 weeks and weigh bearing consider starting to bike/staionary and see if tolerated.

## 2013-01-17 ENCOUNTER — Ambulatory Visit: Payer: 59 | Admitting: Obstetrics & Gynecology

## 2013-01-18 ENCOUNTER — Encounter: Payer: Self-pay | Admitting: Physician Assistant

## 2013-01-22 ENCOUNTER — Encounter: Payer: Self-pay | Admitting: Obstetrics & Gynecology

## 2013-01-22 ENCOUNTER — Ambulatory Visit (INDEPENDENT_AMBULATORY_CARE_PROVIDER_SITE_OTHER): Payer: 59 | Admitting: Obstetrics & Gynecology

## 2013-01-22 ENCOUNTER — Ambulatory Visit: Payer: 59 | Admitting: Obstetrics & Gynecology

## 2013-01-22 VITALS — BP 133/80 | HR 71 | Resp 16 | Ht 63.0 in | Wt 132.0 lb

## 2013-01-22 DIAGNOSIS — Z3169 Encounter for other general counseling and advice on procreation: Secondary | ICD-10-CM

## 2013-01-22 DIAGNOSIS — Z30432 Encounter for removal of intrauterine contraceptive device: Secondary | ICD-10-CM

## 2013-01-22 NOTE — Progress Notes (Signed)
  Subjective:    Patient ID: [name] Green, female    DOB: Dec 06, 1980, 32 y.o.   MRN: [id number]  HPI  She and her husband are interested in a pregnancy. She is taking folic acid. She reports that her periods were normal prior to the IUD placement.  Review of Systems     Objective:   Physical Exam  Her IUD was removed easily and noted to be intact.      Assessment & Plan:   Desire for pregnancy. We discussed the times of greatest fertility. Rec daily folic acid

## 2013-01-30 ENCOUNTER — Ambulatory Visit (INDEPENDENT_AMBULATORY_CARE_PROVIDER_SITE_OTHER): Payer: 59 | Admitting: Physician Assistant

## 2013-01-30 ENCOUNTER — Encounter: Payer: Self-pay | Admitting: Physician Assistant

## 2013-01-30 VITALS — BP 136/81 | HR 94 | Wt 131.0 lb

## 2013-01-30 DIAGNOSIS — S8391XD Sprain of unspecified site of right knee, subsequent encounter: Secondary | ICD-10-CM

## 2013-01-30 DIAGNOSIS — M25569 Pain in unspecified knee: Secondary | ICD-10-CM

## 2013-01-30 DIAGNOSIS — M25561 Pain in right knee: Secondary | ICD-10-CM

## 2013-01-30 NOTE — Patient Instructions (Signed)
Will get MRI of knee.

## 2013-01-30 NOTE — Progress Notes (Signed)
  Subjective:    Patient ID: [name] Green, female    DOB: [birth year], 32 y.o.   MRN: [id number]  HPI Patient is a 32 yo female who presents to the clinic to follow up on right knee sprain from soccer injury approximately 4 weeks ago. xrays were negative for any fracture. She is significantly better but her right knee still continues to give way frequently and any twisting motions makes her knee feel week. She is not running or playing soccer. She is swimming and biking. She wears brace most of the day. Not taking NSAIDS. Pain is not constant but does occur when knee gives way. Doing daily phase two rehab exercises.    Review of Systems     Objective:   Physical Exam  Constitutional: She appears well-developed and well-nourished.  Cardiovascular: Normal rate, regular rhythm and normal heart sounds.   Musculoskeletal:  No joint tenderness to palpation. ROM full but pain around the supralateral anterior knee with full extension. Strength 5/5. Twisting motion on knee causes lateral knee pain.           Assessment & Plan:  Right knee pain/sprain- since she is still having problems with knee giving way would like to get MRI. Discussed with pt that meniscal repair via surgery is not routinely done anymore. I am getting an MRI to rule out meniscal tear. If tear seen could get in with Dr. Rutherford Guys for knee injection and we could also consider formal physical therapy. Stay in knee brace. Start back on NSAIDs regularly for the next couple of weeks. Pt will just use OTC NSAids instead of mobic prescription.

## 2013-01-31 ENCOUNTER — Telehealth: Payer: Self-pay | Admitting: *Deleted

## 2013-01-31 NOTE — Telephone Encounter (Signed)
PA obtained or MRI of right knee WO CM.  # is [phone].  Carolyn at [employer] [employer] [facility] notified.

## 2013-02-02 ENCOUNTER — Ambulatory Visit (HOSPITAL_BASED_OUTPATIENT_CLINIC_OR_DEPARTMENT_OTHER)
Admission: RE | Admit: 2013-02-02 | Discharge: 2013-02-02 | Disposition: A | Discharge status: Home / Self Care | Payer: 59 | Source: Ambulatory Visit | Attending: Physician Assistant | Admitting: Physician Assistant

## 2013-02-02 DIAGNOSIS — S8391XD Sprain of unspecified site of right knee, subsequent encounter: Secondary | ICD-10-CM

## 2013-02-02 DIAGNOSIS — M899 Disorder of bone, unspecified: Secondary | ICD-10-CM | POA: Insufficient documentation

## 2013-02-02 DIAGNOSIS — M25561 Pain in right knee: Secondary | ICD-10-CM

## 2013-02-02 DIAGNOSIS — Y9366 Activity, soccer: Secondary | ICD-10-CM | POA: Insufficient documentation

## 2013-02-02 DIAGNOSIS — M25469 Effusion, unspecified knee: Secondary | ICD-10-CM | POA: Insufficient documentation

## 2013-02-02 DIAGNOSIS — W219XXA Striking against or struck by unspecified sports equipment, initial encounter: Secondary | ICD-10-CM | POA: Insufficient documentation

## 2013-02-02 DIAGNOSIS — S83509A Sprain of unspecified cruciate ligament of unspecified knee, initial encounter: Secondary | ICD-10-CM | POA: Insufficient documentation

## 2013-02-02 DIAGNOSIS — M25569 Pain in unspecified knee: Secondary | ICD-10-CM | POA: Insufficient documentation

## 2013-02-02 DIAGNOSIS — S83289A Other tear of lateral meniscus, current injury, unspecified knee, initial encounter: Secondary | ICD-10-CM | POA: Insufficient documentation

## 2013-02-03 HISTORY — PX: ANTERIOR CRUCIATE LIGAMENT REPAIR: SHX115

## 2013-02-04 ENCOUNTER — Other Ambulatory Visit: Payer: Self-pay | Admitting: Physician Assistant

## 2013-02-04 DIAGNOSIS — S83511D Sprain of anterior cruciate ligament of right knee, subsequent encounter: Secondary | ICD-10-CM

## 2013-03-07 ENCOUNTER — Ambulatory Visit: Payer: 59 | Attending: Specialist | Admitting: Physical Therapy

## 2013-03-07 DIAGNOSIS — IMO0001 Reserved for inherently not codable concepts without codable children: Secondary | ICD-10-CM | POA: Insufficient documentation

## 2013-03-07 DIAGNOSIS — R609 Edema, unspecified: Secondary | ICD-10-CM | POA: Insufficient documentation

## 2013-03-07 DIAGNOSIS — M25669 Stiffness of unspecified knee, not elsewhere classified: Secondary | ICD-10-CM | POA: Insufficient documentation

## 2013-03-07 DIAGNOSIS — M25569 Pain in unspecified knee: Secondary | ICD-10-CM | POA: Insufficient documentation

## 2013-03-07 DIAGNOSIS — M6281 Muscle weakness (generalized): Secondary | ICD-10-CM | POA: Insufficient documentation

## 2013-03-07 DIAGNOSIS — R269 Unspecified abnormalities of gait and mobility: Secondary | ICD-10-CM | POA: Insufficient documentation

## 2013-03-11 ENCOUNTER — Ambulatory Visit: Payer: 59 | Admitting: Physical Therapy

## 2013-03-14 ENCOUNTER — Ambulatory Visit: Payer: 59 | Admitting: Physical Therapy

## 2013-03-18 ENCOUNTER — Ambulatory Visit: Payer: 59 | Admitting: Physical Therapy

## 2013-03-21 ENCOUNTER — Ambulatory Visit: Payer: 59 | Admitting: Physical Therapy

## 2013-03-25 ENCOUNTER — Ambulatory Visit: Payer: 59 | Admitting: Physical Therapy

## 2013-03-28 ENCOUNTER — Ambulatory Visit: Payer: 59 | Admitting: Physical Therapy

## 2013-04-08 ENCOUNTER — Ambulatory Visit: Payer: 59 | Attending: Specialist | Admitting: Physical Therapy

## 2013-04-08 DIAGNOSIS — M25669 Stiffness of unspecified knee, not elsewhere classified: Secondary | ICD-10-CM | POA: Insufficient documentation

## 2013-04-08 DIAGNOSIS — R609 Edema, unspecified: Secondary | ICD-10-CM | POA: Insufficient documentation

## 2013-04-08 DIAGNOSIS — M25569 Pain in unspecified knee: Secondary | ICD-10-CM | POA: Insufficient documentation

## 2013-04-08 DIAGNOSIS — R269 Unspecified abnormalities of gait and mobility: Secondary | ICD-10-CM | POA: Insufficient documentation

## 2013-04-08 DIAGNOSIS — IMO0001 Reserved for inherently not codable concepts without codable children: Secondary | ICD-10-CM | POA: Insufficient documentation

## 2013-04-08 DIAGNOSIS — M6281 Muscle weakness (generalized): Secondary | ICD-10-CM | POA: Insufficient documentation

## 2013-04-11 ENCOUNTER — Ambulatory Visit: Payer: 59 | Admitting: Physical Therapy

## 2013-04-15 ENCOUNTER — Ambulatory Visit: Payer: 59 | Admitting: Physical Therapy

## 2013-04-18 ENCOUNTER — Encounter: Payer: 59 | Admitting: Physical Therapy

## 2013-04-22 ENCOUNTER — Ambulatory Visit: Payer: 59 | Admitting: Physical Therapy

## 2013-04-25 ENCOUNTER — Encounter: Payer: 59 | Admitting: Physical Therapy

## 2013-04-29 ENCOUNTER — Ambulatory Visit: Payer: 59 | Admitting: Physical Therapy

## 2013-05-02 ENCOUNTER — Ambulatory Visit: Payer: 59 | Admitting: Physical Therapy

## 2013-05-09 ENCOUNTER — Ambulatory Visit: Payer: 59 | Attending: Specialist | Admitting: Physical Therapy

## 2013-05-09 DIAGNOSIS — R269 Unspecified abnormalities of gait and mobility: Secondary | ICD-10-CM | POA: Insufficient documentation

## 2013-05-09 DIAGNOSIS — IMO0001 Reserved for inherently not codable concepts without codable children: Secondary | ICD-10-CM | POA: Insufficient documentation

## 2013-05-09 DIAGNOSIS — M25669 Stiffness of unspecified knee, not elsewhere classified: Secondary | ICD-10-CM | POA: Insufficient documentation

## 2013-05-09 DIAGNOSIS — M6281 Muscle weakness (generalized): Secondary | ICD-10-CM | POA: Insufficient documentation

## 2013-05-09 DIAGNOSIS — M25569 Pain in unspecified knee: Secondary | ICD-10-CM | POA: Insufficient documentation

## 2013-05-09 DIAGNOSIS — R609 Edema, unspecified: Secondary | ICD-10-CM | POA: Insufficient documentation

## 2013-05-24 ENCOUNTER — Ambulatory Visit: Payer: 59 | Admitting: Physical Therapy

## 2013-05-30 ENCOUNTER — Ambulatory Visit: Payer: 59 | Admitting: Physical Therapy

## 2013-06-07 ENCOUNTER — Ambulatory Visit: Payer: 59 | Attending: Specialist | Admitting: Physical Therapy

## 2013-06-07 DIAGNOSIS — M6281 Muscle weakness (generalized): Secondary | ICD-10-CM | POA: Insufficient documentation

## 2013-06-07 DIAGNOSIS — R609 Edema, unspecified: Secondary | ICD-10-CM | POA: Insufficient documentation

## 2013-06-07 DIAGNOSIS — IMO0001 Reserved for inherently not codable concepts without codable children: Secondary | ICD-10-CM | POA: Insufficient documentation

## 2013-06-07 DIAGNOSIS — R269 Unspecified abnormalities of gait and mobility: Secondary | ICD-10-CM | POA: Insufficient documentation

## 2013-06-07 DIAGNOSIS — M25569 Pain in unspecified knee: Secondary | ICD-10-CM | POA: Insufficient documentation

## 2013-06-07 DIAGNOSIS — M25669 Stiffness of unspecified knee, not elsewhere classified: Secondary | ICD-10-CM | POA: Insufficient documentation

## 2013-06-10 ENCOUNTER — Ambulatory Visit: Payer: 59 | Admitting: Physical Therapy

## 2013-06-28 ENCOUNTER — Ambulatory Visit (INDEPENDENT_AMBULATORY_CARE_PROVIDER_SITE_OTHER): Payer: 59 | Admitting: Family Medicine

## 2013-06-28 DIAGNOSIS — Z23 Encounter for immunization: Secondary | ICD-10-CM

## 2013-06-28 NOTE — Progress Notes (Signed)
Flu shot given and tolerated well.Loralee Pacas [place]

## 2013-08-07 ENCOUNTER — Encounter: Payer: 59 | Admitting: Advanced Practice Midwife

## 2013-08-09 ENCOUNTER — Telehealth: Payer: Self-pay | Admitting: *Deleted

## 2013-08-09 DIAGNOSIS — R112 Nausea with vomiting, unspecified: Secondary | ICD-10-CM

## 2013-08-09 MED ORDER — ONDANSETRON 4 MG PO TBDP: 4.0000 mg | ORAL_TABLET | Freq: Four times a day (QID) | ORAL | Status: DC | PRN

## 2013-08-09 NOTE — Telephone Encounter (Signed)
Pt called in to adv she has been suffering with N/V and currently [redacted] weeks pregnant. Pt has initial prenatal visit scheduled 08/22/13. Verified allergies, medications, and preferred pharmacy. Sent script to pharm for zofran. Adv pt zofran can cause constipation and may need to take stool softener along with it.

## 2013-08-16 ENCOUNTER — Other Ambulatory Visit: Payer: Self-pay | Admitting: Obstetrics & Gynecology

## 2013-08-19 ENCOUNTER — Other Ambulatory Visit: Payer: Self-pay | Admitting: Obstetrics & Gynecology

## 2013-08-19 DIAGNOSIS — O219 Vomiting of pregnancy, unspecified: Secondary | ICD-10-CM

## 2013-08-19 NOTE — Telephone Encounter (Signed)
RF on Zofran  Sent to [facility]

## 2013-08-22 ENCOUNTER — Ambulatory Visit (INDEPENDENT_AMBULATORY_CARE_PROVIDER_SITE_OTHER): Payer: 59 | Admitting: Obstetrics & Gynecology

## 2013-08-22 ENCOUNTER — Encounter: Payer: Self-pay | Admitting: Obstetrics & Gynecology

## 2013-08-22 DIAGNOSIS — Z3480 Encounter for supervision of other normal pregnancy, unspecified trimester: Secondary | ICD-10-CM

## 2013-08-22 DIAGNOSIS — Z3491 Encounter for supervision of normal pregnancy, unspecified, first trimester: Secondary | ICD-10-CM

## 2013-08-22 LAB — OB RESULTS CONSOLE GC/CHLAMYDIA
CHLAMYDIA, DNA PROBE: NEGATIVE
Gonorrhea: NEGATIVE

## 2013-08-22 MED ORDER — DOXYLAMINE-PYRIDOXINE 10-10 MG PO TBEC: 2.0000 | DELAYED_RELEASE_TABLET | Freq: Every day | ORAL | Status: DC

## 2013-08-22 MED ORDER — PROMETHAZINE HCL 25 MG PO TABS: 25.0000 mg | ORAL_TABLET | Freq: Four times a day (QID) | ORAL | Status: DC | PRN

## 2013-08-22 NOTE — Addendum Note (Signed)
Addended by: Granville Lewis on: 08/22/2013 04:46 PM   Modules accepted: Orders

## 2013-08-22 NOTE — Progress Notes (Signed)
Bedside U/S showed IUP with positive FHT of 176 bpm with CRL [redacted]w[redacted]d

## 2013-08-22 NOTE — Progress Notes (Signed)
   Subjective:    [name] Green is a G3P2001 [redacted]w[redacted]d being seen today for her first obstetrical visit.  Her obstetrical history is significant for none. Patient does intend to breast feed. Pregnancy history fully reviewed.  Patient reports headache, nausea and vomiting.  There were no vitals filed for this visit.  HISTORY: OB History  Gravida Para Term Preterm AB SAB TAB Ectopic Multiple Living  3 2 2  0 0 0 0 0 0 1    # Outcome Date GA Lbr Len/2nd Weight Sex Delivery Anes PTL Lv  3 CUR           2 TRM 03/19/11 [redacted]w[redacted]d   F      1 TRM 06/02/09 [redacted]w[redacted]d  7 lb 8 oz (3.402 kg) F SVD EPI N Y     Comments: Vacuum delivery     Past Medical History  Diagnosis Date  . Asthma     exercise induced, has not used inhaler in over a year  . Migraines   . Hernia    Past Surgical History  Procedure Laterality Date  . Wisdom tooth extraction  2004  . Inguinal hernia repair      < 3 yo  . Hernia repair  07/15/11    umb hernia  . Cystectomy  1992   Family History  Problem Relation Age of Onset  . Diabetes      grandparent  . Stroke      grandparent  . Heart attack      grandparent  . Lung cancer      grandparent  . Cancer Father     prostate  . Heart disease Paternal Grandfather      Exam    Uterus:     Pelvic Exam:    Perineum: No Hemorrhoids   Vulva: normal   Vagina:  normal mucosa   pH: n/a   Cervix: no lesions   Adnexa: normal adnexa   Bony Pelvis: average  System: Breast:  normal appearance, no masses or tenderness   Skin: normal coloration and turgor, no rashes    Neurologic: oriented, normal mood   Extremities: normal strength, tone, and muscle mass   HEENT sclera clear, anicteric, oropharynx clear, no lesions, neck supple with midline trachea and thyroid without masses   Mouth/Teeth mucous membranes moist, pharynx normal without lesions and dental hygiene good   Neck supple and no masses   Cardiovascular: regular rate and rhythm   Respiratory:  appears well,  vitals normal, no respiratory distress, acyanotic, normal RR, ear and throat exam is normal, neck free of mass or lymphadenopathy, chest clear, no wheezing, crepitations, rhonchi, normal symmetric air entry   Abdomen: soft, non-tender; bowel sounds normal; no masses,  no organomegaly   Urinary: urethral meatus normal and no leakage of urine      Assessment:    Pregnancy: G3P2001 Patient Active Problem List   Diagnosis Date Noted  . Hematoma right gastrocnemius 05/18/2012  . UMBILICAL HERNIA 05/09/2011  . INTRINSIC ASTHMA, UNSPECIFIED 05/15/2008        Plan:     Initial labs drawn. Prenatal vitamins. Problem list reviewed and updated. Genetic Screening discussed and declined  Ultrasound discussed; fetal survey: requested.  Follow up in 4 weeks. Pt having n/v--currently on zofran.  Will try diclegis and phenergan. Pt has had a flu shot this season.   Ariany Kesselman H. 08/22/2013

## 2013-08-23 LAB — OBSTETRIC PANEL
Antibody Screen: NEGATIVE
Basophils Absolute: 0 10*3/uL (ref 0.0–0.1)
Eosinophils Absolute: 0.2 10*3/uL (ref 0.0–0.7)
Eosinophils Relative: 3 % (ref 0–5)
HCT: 37.3 % (ref 36.0–46.0)
Lymphocytes Relative: 12 % (ref 12–46)
MCH: 29.8 pg (ref 26.0–34.0)
MCV: 84.8 fL (ref 78.0–100.0)
Monocytes Absolute: 0.9 10*3/uL (ref 0.1–1.0)
RDW: 14 % (ref 11.5–15.5)
Rubella: 4.81 Index — ABNORMAL HIGH (ref <0.90)
WBC: 8.8 10*3/uL (ref 4.0–10.5)

## 2013-08-23 LAB — PRESCRIPTION MONITORING PROFILE (19 PANEL)
Barbiturate Screen, Urine: NEGATIVE ng/mL
Benzodiazepine Screen, Urine: NEGATIVE ng/mL
Cannabinoid Scrn, Ur: NEGATIVE ng/mL
Carisoprodol, Urine: NEGATIVE ng/mL
Cocaine Metabolites: NEGATIVE ng/mL
Creatinine, Urine: 146.3 mg/dL (ref >20.0)
Fentanyl, Ur: NEGATIVE ng/mL
Opiate Screen, Urine: NEGATIVE ng/mL
Phencyclidine, Ur: NEGATIVE ng/mL
Propoxyphene: NEGATIVE ng/mL
Zolpidem, Urine: NEGATIVE ng/mL

## 2013-08-23 LAB — HIV ANTIBODY (ROUTINE TESTING W REFLEX): HIV: NONREACTIVE

## 2013-08-24 LAB — CULTURE, URINE COMPREHENSIVE
Colony Count: NO GROWTH
Organism ID, Bacteria: NO GROWTH

## 2013-09-05 NOTE — L&D Delivery Note (Signed)
Attestation of Attending Supervision of Advanced Practitioner (CNM/NP): Evaluation and management procedures were performed by the Advanced Practitioner under my supervision and collaboration. I have reviewed the Advanced Practitioner's note and chart, and I agree with the management and plan.  Gesenia Bantz H. 8:20 AM

## 2013-09-05 NOTE — L&D Delivery Note (Signed)
Delivery Note At 11:39 AM a viable and healthy female was delivered via Vaginal, Spontaneous Delivery (Presentation: Right Occiput Anterior).  APGAR: 8, 9; weight pending .   Placenta status: Intact, Spontaneous.  Cord: 3 vessels with the following complications: None.  Cord pH: n/a  Anesthesia: Epidural  Episiotomy: None Lacerations:2nd degree Suture Repair: 3.0 vicryl rapide Est. Blood Loss (mL): 400  Mom to postpartum.  Baby to Couplet care / Skin to Skin.  [facility] Phys[facility] And Associates PaMUHAMMAD,Shanna Un 03/12/2014, 12:20 PM

## 2013-09-20 ENCOUNTER — Ambulatory Visit (INDEPENDENT_AMBULATORY_CARE_PROVIDER_SITE_OTHER): Payer: 59 | Admitting: Obstetrics and Gynecology

## 2013-09-20 ENCOUNTER — Encounter: Payer: Self-pay | Admitting: Obstetrics and Gynecology

## 2013-09-20 VITALS — BP 116/74 | Wt 141.0 lb

## 2013-09-20 DIAGNOSIS — Z3480 Encounter for supervision of other normal pregnancy, unspecified trimester: Secondary | ICD-10-CM

## 2013-09-20 NOTE — Progress Notes (Signed)
p-93 

## 2013-09-20 NOTE — Progress Notes (Signed)
Fundus 1/3 to u. Vomits about once/day and Phenergan helping. Does not want genetic screening or to know gender. May want waterbirth.

## 2013-09-20 NOTE — Patient Instructions (Signed)
Second Trimester of Pregnancy The second trimester is from week 13 through week 28, months 4 through 6. The second trimester is often a time when you feel your best. Your body has also adjusted to being pregnant, and you begin to feel better physically. Usually, morning sickness has lessened or quit completely, you may have more energy, and you may have an increase in appetite. The second trimester is also a time when the fetus is growing rapidly. At the end of the sixth month, the fetus is about 9 inches long and weighs about 1 pounds. You will likely begin to feel the baby move (quickening) between 18 and 20 weeks of the pregnancy. BODY CHANGES Your body goes through many changes during pregnancy. The changes vary from woman to woman.   Your weight will continue to increase. You will notice your lower abdomen bulging out.  You may begin to get stretch marks on your hips, abdomen, and breasts.  You may develop headaches that can be relieved by medicines approved by your caregiver.  You may urinate more often because the fetus is pressing on your bladder.  You may develop or continue to have heartburn as a result of your pregnancy.  You may develop constipation because certain hormones are causing the muscles that push waste through your intestines to slow down.  You may develop hemorrhoids or swollen, bulging veins (varicose veins).  You may have back pain because of the weight gain and pregnancy hormones relaxing your joints between the bones in your pelvis and as a result of a shift in weight and the muscles that support your balance.  Your breasts will continue to grow and be tender.  Your gums may bleed and may be sensitive to brushing and flossing.  Dark spots or blotches (chloasma, mask of pregnancy) may develop on your face. This will likely fade after the baby is born.  A dark line from your belly button to the pubic area (linea nigra) may appear. This will likely fade after the  baby is born. WHAT TO EXPECT AT YOUR PRENATAL VISITS During a routine prenatal visit:  You will be weighed to make sure you and the fetus are growing normally.  Your blood pressure will be taken.  Your abdomen will be measured to track your baby's growth.  The fetal heartbeat will be listened to.  Any test results from the previous visit will be discussed. Your caregiver may ask you:  How you are feeling.  If you are feeling the baby move.  If you have had any abnormal symptoms, such as leaking fluid, bleeding, severe headaches, or abdominal cramping.  If you have any questions. Other tests that may be performed during your second trimester include:  Blood tests that check for:  Low iron levels (anemia).  Gestational diabetes (between 24 and 28 weeks).  Rh antibodies.  Urine tests to check for infections, diabetes, or protein in the urine.  An ultrasound to confirm the proper growth and development of the baby.  An amniocentesis to check for possible genetic problems.  Fetal screens for spina bifida and Down syndrome. HOME CARE INSTRUCTIONS   Avoid all smoking, herbs, alcohol, and unprescribed drugs. These chemicals affect the formation and growth of the baby.  Follow your caregiver's instructions regarding medicine use. There are medicines that are either safe or unsafe to take during pregnancy.  Exercise only as directed by your caregiver. Experiencing uterine cramps is a good sign to stop exercising.  Continue to eat regular,   healthy meals.  Wear a good support bra for breast tenderness.  Do not use hot tubs, steam rooms, or saunas.  Wear your seat belt at all times when driving.  Avoid raw meat, uncooked cheese, cat litter boxes, and soil used by cats. These carry germs that can cause birth defects in the baby.  Take your prenatal vitamins.  Try taking a stool softener (if your caregiver approves) if you develop constipation. Eat more high-fiber foods,  such as fresh vegetables or fruit and whole grains. Drink plenty of fluids to keep your urine clear or pale yellow.  Take warm sitz baths to soothe any pain or discomfort caused by hemorrhoids. Use hemorrhoid cream if your caregiver approves.  If you develop varicose veins, wear support hose. Elevate your feet for 15 minutes, 3 4 times a day. Limit salt in your diet.  Avoid heavy lifting, wear low heel shoes, and practice good posture.  Rest with your legs elevated if you have leg cramps or low back pain.  Visit your dentist if you have not gone yet during your pregnancy. Use a soft toothbrush to brush your teeth and be gentle when you floss.  A sexual relationship may be continued unless your caregiver directs you otherwise.  Continue to go to all your prenatal visits as directed by your caregiver. SEEK MEDICAL CARE IF:   You have dizziness.  You have mild pelvic cramps, pelvic pressure, or nagging pain in the abdominal area.  You have persistent nausea, vomiting, or diarrhea.  You have a bad smelling vaginal discharge.  You have pain with urination. SEEK IMMEDIATE MEDICAL CARE IF:   You have a fever.  You are leaking fluid from your vagina.  You have spotting or bleeding from your vagina.  You have severe abdominal cramping or pain.  You have rapid weight gain or loss.  You have shortness of breath with chest pain.  You notice sudden or extreme swelling of your face, hands, ankles, feet, or legs.  You have not felt your baby move in over an hour.  You have severe headaches that do not go away with medicine.  You have vision changes. Document Released: 08/16/2001 Document Revised: 04/24/2013 Document Reviewed: 10/23/2012 [employer] Patient Information 2014 [employer].  

## 2013-10-14 ENCOUNTER — Ambulatory Visit (INDEPENDENT_AMBULATORY_CARE_PROVIDER_SITE_OTHER): Payer: 59 | Admitting: Sports Medicine

## 2013-10-14 ENCOUNTER — Encounter: Payer: Self-pay | Admitting: Sports Medicine

## 2013-10-14 VITALS — BP 118/65 | HR 92 | Temp 97.7°F | Ht 63.0 in | Wt 147.0 lb

## 2013-10-14 DIAGNOSIS — J45909 Unspecified asthma, uncomplicated: Secondary | ICD-10-CM

## 2013-10-14 MED ORDER — HYDROCOD POLST-CHLORPHEN POLST 10-8 MG/5ML PO LQCR: 5.0000 mL | Freq: Two times a day (BID) | ORAL | Status: DC | PRN

## 2013-10-14 MED ORDER — ALBUTEROL SULFATE HFA 108 (90 BASE) MCG/ACT IN AERS: 2.0000 | INHALATION_SPRAY | Freq: Four times a day (QID) | RESPIRATORY_TRACT | Status: DC | PRN

## 2013-10-14 MED ORDER — AZITHROMYCIN 250 MG PO TABS: ORAL_TABLET | ORAL | Status: DC

## 2013-10-14 NOTE — Progress Notes (Signed)
  Subjective:    CC: Sick  HPI: This is a very pleasant 33 year old female who is pregnant and has a history of asthma, she has been sick now for 3 weeks and coughing for the last 2 weeks, reading up yellowish mucus. No fevers, chills, night sweats, no muscle aches or body aches, no GI symptoms. Symptoms are moderate, persistent. She does need a refill of her albuterol.  Past medical history, Surgical history, Family history not pertinant except as noted below, Social history, Allergies, and medications have been entered into the medical record, reviewed, and no changes needed.   Review of Systems: No fevers, chills, night sweats, weight loss, chest pain, or shortness of breath.   Objective:    General: Well Developed, well nourished, and in no acute distress.  Neuro: Alert and oriented x3, extra-ocular muscles intact, sensation grossly intact.  HEENT: Normocephalic, atraumatic, pupils equal round reactive to light, neck supple, no masses, no lymphadenopathy, thyroid nonpalpable.  Skin: Warm and dry, no rashes. Cardiac: Regular rate and rhythm, no murmurs rubs or gallops, no lower extremity edema.  Respiratory: Clear to auscultation bilaterally. Not using accessory muscles, speaking in full sentences. Coughing in exam room.  Impression and Recommendations:

## 2013-10-14 NOTE — Assessment & Plan Note (Signed)
Currently a mild exacerbation. Refilling albuterol, Tussionex for cough, azithromycin. Return if no better in one to 2 weeks.

## 2013-10-18 ENCOUNTER — Encounter: Payer: Self-pay | Admitting: Advanced Practice Midwife

## 2013-10-18 ENCOUNTER — Ambulatory Visit (INDEPENDENT_AMBULATORY_CARE_PROVIDER_SITE_OTHER): Payer: 59 | Admitting: Advanced Practice Midwife

## 2013-10-18 VITALS — BP 108/59 | Wt 144.0 lb

## 2013-10-18 DIAGNOSIS — Z3480 Encounter for supervision of other normal pregnancy, unspecified trimester: Secondary | ICD-10-CM | POA: Insufficient documentation

## 2013-10-18 NOTE — Progress Notes (Signed)
p-85 

## 2013-10-18 NOTE — Progress Notes (Signed)
Doing well.  Feeling fetal movement, denies vaginal bleeding, LOF, cramping/contractions. Discussed safe exercise in pregnancy, pt Ok to continue working out/riding bike in gym.  Desires waterbirth, wants to schedule class.

## 2013-11-01 ENCOUNTER — Ambulatory Visit (HOSPITAL_COMMUNITY)
Admission: RE | Admit: 2013-11-01 | Discharge: 2013-11-01 | Disposition: A | Discharge status: Home / Self Care | Payer: 59 | Source: Ambulatory Visit | Attending: Advanced Practice Midwife | Admitting: Advanced Practice Midwife

## 2013-11-01 DIAGNOSIS — O9989 Other specified diseases and conditions complicating pregnancy, childbirth and the puerperium: Principal | ICD-10-CM

## 2013-11-01 DIAGNOSIS — J45909 Unspecified asthma, uncomplicated: Secondary | ICD-10-CM | POA: Insufficient documentation

## 2013-11-01 DIAGNOSIS — Z3689 Encounter for other specified antenatal screening: Secondary | ICD-10-CM | POA: Insufficient documentation

## 2013-11-01 DIAGNOSIS — O99891 Other specified diseases and conditions complicating pregnancy: Secondary | ICD-10-CM | POA: Insufficient documentation

## 2013-11-01 DIAGNOSIS — Z3480 Encounter for supervision of other normal pregnancy, unspecified trimester: Secondary | ICD-10-CM

## 2013-11-03 ENCOUNTER — Encounter: Payer: Self-pay | Admitting: Advanced Practice Midwife

## 2013-11-15 ENCOUNTER — Ambulatory Visit (INDEPENDENT_AMBULATORY_CARE_PROVIDER_SITE_OTHER): Payer: 59 | Admitting: Advanced Practice Midwife

## 2013-11-15 VITALS — BP 102/65 | Wt 152.0 lb

## 2013-11-15 DIAGNOSIS — Z3480 Encounter for supervision of other normal pregnancy, unspecified trimester: Secondary | ICD-10-CM

## 2013-11-15 NOTE — Progress Notes (Signed)
P - 90 - Pt c/o sciatica pain in lower back 4/10 intermittently

## 2013-11-15 NOTE — Patient Instructions (Signed)
Second Trimester of Pregnancy The second trimester is from week 13 through week 28, months 4 through 6. The second trimester is often a time when you feel your best. Your body has also adjusted to being pregnant, and you begin to feel better physically. Usually, morning sickness has lessened or quit completely, you may have more energy, and you may have an increase in appetite. The second trimester is also a time when the fetus is growing rapidly. At the end of the sixth month, the fetus is about 9 inches long and weighs about 1 pounds. You will likely begin to feel the baby move (quickening) between 18 and 20 weeks of the pregnancy. BODY CHANGES Your body goes through many changes during pregnancy. The changes vary from woman to woman.   Your weight will continue to increase. You will notice your lower abdomen bulging out.  You may begin to get stretch marks on your hips, abdomen, and breasts.  You may develop headaches that can be relieved by medicines approved by your caregiver.  You may urinate more often because the fetus is pressing on your bladder.  You may develop or continue to have heartburn as a result of your pregnancy.  You may develop constipation because certain hormones are causing the muscles that push waste through your intestines to slow down.  You may develop hemorrhoids or swollen, bulging veins (varicose veins).  You may have back pain because of the weight gain and pregnancy hormones relaxing your joints between the bones in your pelvis and as a result of a shift in weight and the muscles that support your balance.  Your breasts will continue to grow and be tender.  Your gums may bleed and may be sensitive to brushing and flossing.  Dark spots or blotches (chloasma, mask of pregnancy) may develop on your face. This will likely fade after the baby is born.  A dark line from your belly button to the pubic area (linea nigra) may appear. This will likely fade after the  baby is born. WHAT TO EXPECT AT YOUR PRENATAL VISITS During a routine prenatal visit:  You will be weighed to make sure you and the fetus are growing normally.  Your blood pressure will be taken.  Your abdomen will be measured to track your baby's growth.  The fetal heartbeat will be listened to.  Any test results from the previous visit will be discussed. Your caregiver may ask you:  How you are feeling.  If you are feeling the baby move.  If you have had any abnormal symptoms, such as leaking fluid, bleeding, severe headaches, or abdominal cramping.  If you have any questions. Other tests that may be performed during your second trimester include:  Blood tests that check for:  Low iron levels (anemia).  Gestational diabetes (between 24 and 28 weeks).  Rh antibodies.  Urine tests to check for infections, diabetes, or protein in the urine.  An ultrasound to confirm the proper growth and development of the baby.  An amniocentesis to check for possible genetic problems.  Fetal screens for spina bifida and Down syndrome. HOME CARE INSTRUCTIONS   Avoid all smoking, herbs, alcohol, and unprescribed drugs. These chemicals affect the formation and growth of the baby.  Follow your caregiver's instructions regarding medicine use. There are medicines that are either safe or unsafe to take during pregnancy.  Exercise only as directed by your caregiver. Experiencing uterine cramps is a good sign to stop exercising.  Continue to eat regular,   healthy meals.  Wear a good support bra for breast tenderness.  Do not use hot tubs, steam rooms, or saunas.  Wear your seat belt at all times when driving.  Avoid raw meat, uncooked cheese, cat litter boxes, and soil used by cats. These carry germs that can cause birth defects in the baby.  Take your prenatal vitamins.  Try taking a stool softener (if your caregiver approves) if you develop constipation. Eat more high-fiber foods,  such as fresh vegetables or fruit and whole grains. Drink plenty of fluids to keep your urine clear or pale yellow.  Take warm sitz baths to soothe any pain or discomfort caused by hemorrhoids. Use hemorrhoid cream if your caregiver approves.  If you develop varicose veins, wear support hose. Elevate your feet for 15 minutes, 3 4 times a day. Limit salt in your diet.  Avoid heavy lifting, wear low heel shoes, and practice good posture.  Rest with your legs elevated if you have leg cramps or low back pain.  Visit your dentist if you have not gone yet during your pregnancy. Use a soft toothbrush to brush your teeth and be gentle when you floss.  A sexual relationship may be continued unless your caregiver directs you otherwise.  Continue to go to all your prenatal visits as directed by your caregiver. SEEK MEDICAL CARE IF:   You have dizziness.  You have mild pelvic cramps, pelvic pressure, or nagging pain in the abdominal area.  You have persistent nausea, vomiting, or diarrhea.  You have a bad smelling vaginal discharge.  You have pain with urination. SEEK IMMEDIATE MEDICAL CARE IF:   You have a fever.  You are leaking fluid from your vagina.  You have spotting or bleeding from your vagina.  You have severe abdominal cramping or pain.  You have rapid weight gain or loss.  You have shortness of breath with chest pain.  You notice sudden or extreme swelling of your face, hands, ankles, feet, or legs.  You have not felt your baby move in over an hour.  You have severe headaches that do not go away with medicine.  You have vision changes. Document Released: 08/16/2001 Document Revised: 04/24/2013 Document Reviewed: 10/23/2012 [employer] Patient Information 2014 [employer].  

## 2013-11-15 NOTE — Progress Notes (Signed)
Still having some nausea at times. Remedies reviewed. Add B6, papaya, ginger, peppermint teas. May continue Phenergan or Diclegis

## 2013-12-23 ENCOUNTER — Ambulatory Visit (INDEPENDENT_AMBULATORY_CARE_PROVIDER_SITE_OTHER): Payer: 59 | Admitting: Advanced Practice Midwife

## 2013-12-23 VITALS — BP 101/65 | Wt 159.0 lb

## 2013-12-23 DIAGNOSIS — Z3480 Encounter for supervision of other normal pregnancy, unspecified trimester: Secondary | ICD-10-CM

## 2013-12-23 NOTE — Patient Instructions (Signed)
Tetanus, Diphtheria, Pertussis (Tdap) Vaccine What You Need to Know WHY GET VACCINATED? Tetanus, diphtheria and pertussis can be very serious diseases, even for adolescents and adults. Tdap vaccine can protect us from these diseases. TETANUS (Lockjaw) causes painful muscle tightening and stiffness, usually all over the body.  It can lead to tightening of muscles in the head and neck so you can't open your mouth, swallow, or sometimes even breathe. Tetanus kills about 1 out of 5 people who are infected. DIPHTHERIA can cause a thick coating to form in the back of the throat.  It can lead to breathing problems, paralysis, heart failure, and death. PERTUSSIS (Whooping Cough) causes severe coughing spells, which can cause difficulty breathing, vomiting and disturbed sleep.  It can also lead to weight loss, incontinence, and rib fractures. Up to 2 in 100 adolescents and 5 in 100 adults with pertussis are hospitalized or have complications, which could include pneumonia and death. These diseases are caused by bacteria. Diphtheria and pertussis are spread from person to person through coughing or sneezing. Tetanus enters the body through cuts, scratches, or wounds. Before vaccines, the [place] saw as many as 200,000 cases a year of diphtheria and pertussis, and hundreds of cases of tetanus. Since vaccination began, tetanus and diphtheria have dropped by about 99% and pertussis by about 80%. TDAP VACCINE Tdap vaccine can protect adolescents and adults from tetanus, diphtheria, and pertussis. One dose of Tdap is routinely given at age 11 or 12. People who did not get Tdap at that age should get it as soon as possible. Tdap is especially important for health care professionals and anyone having close contact with a baby younger than 12 months. Pregnant women should get a dose of Tdap during every pregnancy, to protect the newborn from pertussis. Infants are most at risk for severe, life-threatening  complications from pertussis. A similar vaccine, called Td, protects from tetanus and diphtheria, but not pertussis. A Td booster should be given every 10 years. Tdap may be given as one of these boosters if you have not already gotten a dose. Tdap may also be given after a severe cut or burn to prevent tetanus infection. Your doctor can give you more information. Tdap may safely be given at the same time as other vaccines. SOME PEOPLE SHOULD NOT GET THIS VACCINE  If you ever had a life-threatening allergic reaction after a dose of any tetanus, diphtheria, or pertussis containing vaccine, OR if you have a severe allergy to any part of this vaccine, you should not get Tdap. Tell your doctor if you have any severe allergies.  If you had a coma, or long or multiple seizures within 7 days after a childhood dose of DTP or DTaP, you should not get Tdap, unless a cause other than the vaccine was found. You can still get Td.  Talk to your doctor if you:  have epilepsy or another nervous system problem,  had severe pain or swelling after any vaccine containing diphtheria, tetanus or pertussis,  ever had Guillain-Barr Syndrome (GBS),  aren't feeling well on the day the shot is scheduled. RISKS OF A VACCINE REACTION With any medicine, including vaccines, there is a chance of side effects. These are usually mild and go away on their own, but serious reactions are also possible. Brief fainting spells can follow a vaccination, leading to injuries from falling. Sitting or lying down for about 15 minutes can help prevent these. Tell your doctor if you feel dizzy or light-headed, or   have vision changes or ringing in the ears. Mild problems following Tdap (Did not interfere with activities)  Pain where the shot was given (about 3 in 4 adolescents or 2 in 3 adults)  Redness or swelling where the shot was given (about 1 person in 5)  Mild fever of at least 100.4F (up to about 1 in 25 adolescents or 1 in  100 adults)  Headache (about 3 or 4 people in 10)  Tiredness (about 1 person in 3 or 4)  Nausea, vomiting, diarrhea, stomach ache (up to 1 in 4 adolescents or 1 in 10 adults)  Chills, body aches, sore joints, rash, swollen glands (uncommon) Moderate problems following Tdap (Interfered with activities, but did not require medical attention)  Pain where the shot was given (about 1 in 5 adolescents or 1 in 100 adults)  Redness or swelling where the shot was given (up to about 1 in 16 adolescents or 1 in 25 adults)  Fever over 102F (about 1 in 100 adolescents or 1 in 250 adults)  Headache (about 3 in 20 adolescents or 1 in 10 adults)  Nausea, vomiting, diarrhea, stomach ache (up to 1 or 3 people in 100)  Swelling of the entire arm where the shot was given (up to about 3 in 100). Severe problems following Tdap (Unable to perform usual activities, required medical attention)  Swelling, severe pain, bleeding and redness in the arm where the shot was given (rare). A severe allergic reaction could occur after any vaccine (estimated less than 1 in a million doses). WHAT IF THERE IS A SERIOUS REACTION? What should I look for?  Look for anything that concerns you, such as signs of a severe allergic reaction, very high fever, or behavior changes. Signs of a severe allergic reaction can include hives, swelling of the face and throat, difficulty breathing, a fast heartbeat, dizziness, and weakness. These would start a few minutes to a few hours after the vaccination. What should I do?  If you think it is a severe allergic reaction or other emergency that can't wait, call 9-1-1 or get the person to the nearest hospital. Otherwise, call your doctor.  Afterward, the reaction should be reported to the "Vaccine Adverse Event Reporting System" (VAERS). Your doctor might file this report, or you can do it yourself through the VAERS web site at www.vaers.hhs.gov, or by calling 1-800-822-7967. VAERS is  only for reporting reactions. They do not give medical advice.  THE NATIONAL VACCINE INJURY COMPENSATION PROGRAM The National Vaccine Injury Compensation Program (VICP) is a federal program that was created to compensate people who may have been injured by certain vaccines. Persons who believe they may have been injured by a vaccine can learn about the program and about filing a claim by calling 1-800-338-2382 or visiting the VICP website at www.hrsa.gov/vaccinecompensation. HOW CAN I LEARN MORE?  Ask your doctor.  Call your local or state health department.  Contact the Centers for Disease Control and Prevention (CDC):  Call 1-800-232-4636 or visit CDC's website at www.cdc.gov/vaccines. CDC Tdap Vaccine VIS (01/12/12) Document Released: 02/21/2012 Document Revised: 12/17/2012 Document Reviewed: 12/12/2012 [employer] Patient Information 2014 [employer].   Breastfeeding Deciding to breastfeed is one of the best choices you can make for you and your baby. A change in hormones during pregnancy causes your breast tissue to grow and increases the number and size of your milk ducts. These hormones also allow proteins, sugars, and fats from your blood supply to make breast milk in your milk-producing   glands. Hormones prevent breast milk from being released before your baby is born as well as prompt milk flow after birth. Once breastfeeding has begun, thoughts of your baby, as well as his or her sucking or crying, can stimulate the release of milk from your milk-producing glands.  BENEFITS OF BREASTFEEDING For Your Baby  Your first milk (colostrum) helps your baby's digestive system function better.   There are antibodies in your milk that help your baby fight off infections.   Your baby has a lower incidence of asthma, allergies, and sudden infant death syndrome.   The nutrients in breast milk are better for your baby than infant formulas and are designed uniquely for your baby's needs.    Breast milk improves your baby's brain development.   Your baby is less likely to develop other conditions, such as childhood obesity, asthma, or type 2 diabetes mellitus.  For You   Breastfeeding helps to create a very special bond between you and your baby.   Breastfeeding is convenient. Breast milk is always available at the correct temperature and costs nothing.   Breastfeeding helps to burn calories and helps you lose the weight gained during pregnancy.   Breastfeeding makes your uterus contract to its prepregnancy size faster and slows bleeding (lochia) after you give birth.   Breastfeeding helps to lower your risk of developing type 2 diabetes mellitus, osteoporosis, and breast or ovarian cancer later in life. SIGNS THAT YOUR BABY IS HUNGRY Early Signs of Hunger  Increased alertness or activity.  Stretching.  Movement of the head from side to side.  Movement of the head and opening of the mouth when the corner of the mouth or cheek is stroked (rooting).  Increased sucking sounds, smacking lips, cooing, sighing, or squeaking.  Hand-to-mouth movements.  Increased sucking of fingers or hands. Late Signs of Hunger  Fussing.  Intermittent crying. Extreme Signs of Hunger Signs of extreme hunger will require calming and consoling before your baby will be able to breastfeed successfully. Do not wait for the following signs of extreme hunger to occur before you initiate breastfeeding:   Restlessness.  A loud, strong cry.   Screaming. BREASTFEEDING BASICS Breastfeeding Initiation  Find a comfortable place to sit or lie down, with your neck and back well supported.  Place a pillow or rolled up blanket under your baby to bring him or her to the level of your breast (if you are seated). Nursing pillows are specially designed to help support your arms and your baby while you breastfeed.  Make sure that your baby's abdomen is facing your abdomen.   Gently  massage your breast. With your fingertips, massage from your chest wall toward your nipple in a circular motion. This encourages milk flow. You may need to continue this action during the feeding if your milk flows slowly.  Support your breast with 4 fingers underneath and your thumb above your nipple. Make sure your fingers are well away from your nipple and your baby's mouth.   Stroke your baby's lips gently with your finger or nipple.   When your baby's mouth is open wide enough, quickly bring your baby to your breast, placing your entire nipple and as much of the colored area around your nipple (areola) as possible into your baby's mouth.   More areola should be visible above your baby's upper lip than below the lower lip.   Your baby's tongue should be between his or her lower gum and your breast.   Ensure   that your baby's mouth is correctly positioned around your nipple (latched). Your baby's lips should create a seal on your breast and be turned out (everted).  It is common for your baby to suck about 2 3 minutes in order to start the flow of breast milk. Latching Teaching your baby how to latch on to your breast properly is very important. An improper latch can cause nipple pain and decreased milk supply for you and poor weight gain in your baby. Also, if your baby is not latched onto your nipple properly, he or she may swallow some air during feeding. This can make your baby fussy. Burping your baby when you switch breasts during the feeding can help to get rid of the air. However, teaching your baby to latch on properly is still the best way to prevent fussiness from swallowing air while breastfeeding. Signs that your baby has successfully latched on to your nipple:    Silent tugging or silent sucking, without causing you pain.   Swallowing heard between every 3 4 sucks.    Muscle movement above and in front of his or her ears while sucking.  Signs that your baby has not  successfully latched on to nipple:   Sucking sounds or smacking sounds from your baby while breastfeeding.  Nipple pain. If you think your baby has not latched on correctly, slip your finger into the corner of your baby's mouth to break the suction and place it between your baby's gums. Attempt breastfeeding initiation again. Signs of Successful Breastfeeding Signs from your baby:   A gradual decrease in the number of sucks or complete cessation of sucking.   Falling asleep.   Relaxation of his or her body.   Retention of a small amount of milk in his or her mouth.   Letting go of your breast by himself or herself. Signs from you:  Breasts that have increased in firmness, weight, and size 1 3 hours after feeding.   Breasts that are softer immediately after breastfeeding.  Increased milk volume, as well as a change in milk consistency and color by the 5th day of breastfeeding.   Nipples that are not sore, cracked, or bleeding. Signs That Your Baby is Getting Enough Milk  Wetting at least 3 diapers in a 24-hour period. The urine should be clear and pale yellow by age 5 days.  At least 3 stools in a 24-hour period by age 5 days. The stool should be soft and yellow.  At least 3 stools in a 24-hour period by age 7 days. The stool should be seedy and yellow.  No loss of weight greater than 10% of birth weight during the first 3 days of age.  Average weight gain of 4 7 ounces (120 210 mL) per week after age 4 days.  Consistent daily weight gain by age 5 days, without weight loss after the age of 2 weeks. After a feeding, your baby may spit up a small amount. This is common. BREASTFEEDING FREQUENCY AND DURATION Frequent feeding will help you make more milk and can prevent sore nipples and breast engorgement. Breastfeed when you feel the need to reduce the fullness of your breasts or when your baby shows signs of hunger. This is called "breastfeeding on demand." Avoid  introducing a pacifier to your baby while you are working to establish breastfeeding (the first 4 6 weeks after your baby is born). After this time you may choose to use a pacifier. Research has shown that pacifier   use during the first year of a baby's life decreases the risk of sudden infant death syndrome (SIDS). Allow your baby to feed on each breast as long as he or she wants. Breastfeed until your baby is finished feeding. When your baby unlatches or falls asleep while feeding from the first breast, offer the second breast. Because newborns are often sleepy in the first few weeks of life, you may need to awaken your baby to get him or her to feed. Breastfeeding times will vary from baby to baby. However, the following rules can serve as a guide to help you ensure that your baby is properly fed:  Newborns (babies 4 weeks of age or younger) may breastfeed every 1 3 hours.  Newborns should not go longer than 3 hours during the day or 5 hours during the night without breastfeeding.  You should breastfeed your baby a minimum of 8 times in a 24-hour period until you begin to introduce solid foods to your baby at around 6 months of age. BREAST MILK PUMPING Pumping and storing breast milk allows you to ensure that your baby is exclusively fed your breast milk, even at times when you are unable to breastfeed. This is especially important if you are going back to work while you are still breastfeeding or when you are not able to be present during feedings. Your lactation consultant can give you guidelines on how long it is safe to store breast milk.  A breast pump is a machine that allows you to pump milk from your breast into a sterile bottle. The pumped breast milk can then be stored in a refrigerator or freezer. Some breast pumps are operated by hand, while others use electricity. Ask your lactation consultant which type will work best for you. Breast pumps can be purchased, but some hospitals and  breastfeeding support groups lease breast pumps on a monthly basis. A lactation consultant can teach you how to hand express breast milk, if you prefer not to use a pump.  CARING FOR YOUR BREASTS WHILE YOU BREASTFEED Nipples can become dry, cracked, and sore while breastfeeding. The following recommendations can help keep your breasts moisturized and healthy:  Avoid using soap on your nipples.   Wear a supportive bra. Although not required, special nursing bras and tank tops are designed to allow access to your breasts for breastfeeding without taking off your entire bra or top. Avoid wearing underwire style bras or extremely tight bras.  Air dry your nipples for 3 4minutes after each feeding.   Use only cotton bra pads to absorb leaked breast milk. Leaking of breast milk between feedings is normal.   Use lanolin on your nipples after breastfeeding. Lanolin helps to maintain your skin's normal moisture barrier. If you use pure lanolin you do not need to wash it off before feeding your baby again. Pure lanolin is not toxic to your baby. You may also hand express a few drops of breast milk and gently massage that milk into your nipples and allow the milk to air dry. In the first few weeks after giving birth, some women experience extremely full breasts (engorgement). Engorgement can make your breasts feel heavy, warm, and tender to the touch. Engorgement peaks within 3 5 days after you give birth. The following recommendations can help ease engorgement:  Completely empty your breasts while breastfeeding or pumping. You may want to start by applying warm, moist heat (in the shower or with warm water-soaked hand towels) just before feeding or   pumping. This increases circulation and helps the milk flow. If your baby does not completely empty your breasts while breastfeeding, pump any extra milk after he or she is finished.  Wear a snug bra (nursing or regular) or tank top for 1 2 days to signal your  body to slightly decrease milk production.  Apply ice packs to your breasts, unless this is too uncomfortable for you.  Make sure that your baby is latched on and positioned properly while breastfeeding. If engorgement persists after 48 hours of following these recommendations, contact your health care provider or a lactation consultant. OVERALL HEALTH CARE RECOMMENDATIONS WHILE BREASTFEEDING  Eat healthy foods. Alternate between meals and snacks, eating 3 of each per day. Because what you eat affects your breast milk, some of the foods may make your baby more irritable than usual. Avoid eating these foods if you are sure that they are negatively affecting your baby.  Drink milk, fruit juice, and water to satisfy your thirst (about 10 glasses a day).   Rest often, relax, and continue to take your prenatal vitamins to prevent fatigue, stress, and anemia.  Continue breast self-awareness checks.  Avoid chewing and smoking tobacco.  Avoid alcohol and drug use. Some medicines that may be harmful to your baby can pass through breast milk. It is important to ask your health care provider before taking any medicine, including all over-the-counter and prescription medicine as well as vitamin and herbal supplements. It is possible to become pregnant while breastfeeding. If birth control is desired, ask your health care provider about options that will be safe for your baby. SEEK MEDICAL CARE IF:   You feel like you want to stop breastfeeding or have become frustrated with breastfeeding.  You have painful breasts or nipples.  Your nipples are cracked or bleeding.  Your breasts are red, tender, or warm.  You have a swollen area on either breast.  You have a fever or chills.  You have nausea or vomiting.  You have drainage other than breast milk from your nipples.  Your breasts do not become full before feedings by the 5th day after you give birth.  You feel sad and depressed.  Your  baby is too sleepy to eat well.  Your baby is having trouble sleeping.   Your baby is wetting less than 3 diapers in a 24-hour period.  Your baby has less than 3 stools in a 24-hour period.  Your baby's skin or the white part of his or her eyes becomes yellow.   Your baby is not gaining weight by 5 days of age. SEEK IMMEDIATE MEDICAL CARE IF:   Your baby is overly tired (lethargic) and does not want to wake up and feed.  Your baby develops an unexplained fever. Document Released: 08/22/2005 Document Revised: 04/24/2013 Document Reviewed: 02/13/2013 [employer] Patient Information 2014 [employer].  

## 2013-12-23 NOTE — Progress Notes (Signed)
P = [phone] 

## 2013-12-23 NOTE — Progress Notes (Signed)
No concerns. 28 week labs at [facility].

## 2014-01-06 ENCOUNTER — Ambulatory Visit (INDEPENDENT_AMBULATORY_CARE_PROVIDER_SITE_OTHER): Payer: 59 | Admitting: Advanced Practice Midwife

## 2014-01-06 VITALS — BP 114/69 | HR 85 | Wt 159.0 lb

## 2014-01-06 DIAGNOSIS — Z23 Encounter for immunization: Secondary | ICD-10-CM

## 2014-01-06 DIAGNOSIS — Z3480 Encounter for supervision of other normal pregnancy, unspecified trimester: Secondary | ICD-10-CM

## 2014-01-06 LAB — CBC
HCT: 33.3 % — ABNORMAL LOW (ref 36.0–46.0)
Hemoglobin: 11.6 g/dL — ABNORMAL LOW (ref 12.0–15.0)
MCH: 29.2 pg (ref 26.0–34.0)
MCHC: 34.8 g/dL (ref 30.0–36.0)
MCV: 83.9 fL (ref 78.0–100.0)
PLATELETS: 140 10*3/uL — AB (ref 150–400)
RBC: 3.97 MIL/uL (ref 3.87–5.11)
RDW: 13.4 % (ref 11.5–15.5)
WBC: 7.4 10*3/uL (ref 4.0–10.5)

## 2014-01-06 NOTE — Progress Notes (Signed)
28 week labs. Took waterbirth class.

## 2014-01-06 NOTE — Progress Notes (Signed)
Pt to have 28 wk labs today

## 2014-01-06 NOTE — Patient Instructions (Signed)
Breastfeeding Deciding to breastfeed is one of the best choices you can make for you and your baby. A change in hormones during pregnancy causes your breast tissue to grow and increases the number and size of your milk ducts. These hormones also allow proteins, sugars, and fats from your blood supply to make breast milk in your milk-producing glands. Hormones prevent breast milk from being released before your baby is born as well as prompt milk flow after birth. Once breastfeeding has begun, thoughts of your baby, as well as his or her sucking or crying, can stimulate the release of milk from your milk-producing glands.  BENEFITS OF BREASTFEEDING For Your Baby  Your first milk (colostrum) helps your baby's digestive system function better.   There are antibodies in your milk that help your baby fight off infections.   Your baby has a lower incidence of asthma, allergies, and sudden infant death syndrome.   The nutrients in breast milk are better for your baby than infant formulas and are designed uniquely for your baby's needs.   Breast milk improves your baby's brain development.   Your baby is less likely to develop other conditions, such as childhood obesity, asthma, or type 2 diabetes mellitus.  For You   Breastfeeding helps to create a very special bond between you and your baby.   Breastfeeding is convenient. Breast milk is always available at the correct temperature and costs nothing.   Breastfeeding helps to burn calories and helps you lose the weight gained during pregnancy.   Breastfeeding makes your uterus contract to its prepregnancy size faster and slows bleeding (lochia) after you give birth.   Breastfeeding helps to lower your risk of developing type 2 diabetes mellitus, osteoporosis, and breast or ovarian cancer later in life. SIGNS THAT YOUR BABY IS HUNGRY Early Signs of Hunger  Increased alertness or activity.  Stretching.  Movement of the head from  side to side.  Movement of the head and opening of the mouth when the corner of the mouth or cheek is stroked (rooting).  Increased sucking sounds, smacking lips, cooing, sighing, or squeaking.  Hand-to-mouth movements.  Increased sucking of fingers or hands. Late Signs of Hunger  Fussing.  Intermittent crying. Extreme Signs of Hunger Signs of extreme hunger will require calming and consoling before your baby will be able to breastfeed successfully. Do not wait for the following signs of extreme hunger to occur before you initiate breastfeeding:   Restlessness.  A loud, strong cry.   Screaming. BREASTFEEDING BASICS Breastfeeding Initiation  Find a comfortable place to sit or lie down, with your neck and back well supported.  Place a pillow or rolled up blanket under your baby to bring him or her to the level of your breast (if you are seated). Nursing pillows are specially designed to help support your arms and your baby while you breastfeed.  Make sure that your baby's abdomen is facing your abdomen.   Gently massage your breast. With your fingertips, massage from your chest wall toward your nipple in a circular motion. This encourages milk flow. You may need to continue this action during the feeding if your milk flows slowly.  Support your breast with 4 fingers underneath and your thumb above your nipple. Make sure your fingers are well away from your nipple and your baby's mouth.   Stroke your baby's lips gently with your finger or nipple.   When your baby's mouth is open wide enough, quickly bring your baby to your   breast, placing your entire nipple and as much of the colored area around your nipple (areola) as possible into your baby's mouth.   More areola should be visible above your baby's upper lip than below the lower lip.   Your baby's tongue should be between his or her lower gum and your breast.   Ensure that your baby's mouth is correctly positioned  around your nipple (latched). Your baby's lips should create a seal on your breast and be turned out (everted).  It is common for your baby to suck about 2 3 minutes in order to start the flow of breast milk. Latching Teaching your baby how to latch on to your breast properly is very important. An improper latch can cause nipple pain and decreased milk supply for you and poor weight gain in your baby. Also, if your baby is not latched onto your nipple properly, he or she may swallow some air during feeding. This can make your baby fussy. Burping your baby when you switch breasts during the feeding can help to get rid of the air. However, teaching your baby to latch on properly is still the best way to prevent fussiness from swallowing air while breastfeeding. Signs that your baby has successfully latched on to your nipple:    Silent tugging or silent sucking, without causing you pain.   Swallowing heard between every 3 4 sucks.    Muscle movement above and in front of his or her ears while sucking.  Signs that your baby has not successfully latched on to nipple:   Sucking sounds or smacking sounds from your baby while breastfeeding.  Nipple pain. If you think your baby has not latched on correctly, slip your finger into the corner of your baby's mouth to break the suction and place it between your baby's gums. Attempt breastfeeding initiation again. Signs of Successful Breastfeeding Signs from your baby:   A gradual decrease in the number of sucks or complete cessation of sucking.   Falling asleep.   Relaxation of his or her body.   Retention of a small amount of milk in his or her mouth.   Letting go of your breast by himself or herself. Signs from you:  Breasts that have increased in firmness, weight, and size 1 3 hours after feeding.   Breasts that are softer immediately after breastfeeding.  Increased milk volume, as well as a change in milk consistency and color by  the 5th day of breastfeeding.   Nipples that are not sore, cracked, or bleeding. Signs That Your [employer]el Books is Getting Enough Milk  Wetting at least 3 diapers in a 24-hour period. The urine should be clear and pale yellow by age 64411 days.  At least 3 stools in a 24-hour period by age 64411 days. The stool should be soft and yellow.  At least 3 stools in a 24-hour period by age 644 days. The stool should be seedy and yellow.  No loss of weight greater than 10% of birth weight during the first 22 days of age.  Average weight gain of 4 7 ounces (120 210 mL) per week after age 64 days.  Consistent daily weight gain by age 60 days, without weight loss after the age of 2 weeks. After a feeding, your baby may spit up a small amount. This is common. BREASTFEEDING FREQUENCY AND DURATION Frequent feeding will help you make more milk and can prevent sore nipples and breast engorgement. Breastfeed when you feel the need to reduce  the fullness of your breasts or when your baby shows signs of hunger. This is called "breastfeeding on demand." Avoid introducing a pacifier to your baby while you are working to establish breastfeeding (the first 4 6 weeks after your baby is born). After this time you may choose to use a pacifier. Research has shown that pacifier use during the first year of a baby's life decreases the risk of sudden infant death syndrome (SIDS). Allow your baby to feed on each breast as long as he or she wants. Breastfeed until your baby is finished feeding. When your baby unlatches or falls asleep while feeding from the first breast, offer the second breast. Because newborns are often sleepy in the first few weeks of life, you may need to awaken your baby to get him or her to feed. Breastfeeding times will vary from baby to baby. However, the following rules can serve as a guide to help you ensure that your baby is properly fed:  Newborns (babies 4 weeks of age or younger) may breastfeed every 1 3  hours.  Newborns should not go longer than 3 hours during the day or 5 hours during the night without breastfeeding.  You should breastfeed your baby a minimum of 8 times in a 24-hour period until you begin to introduce solid foods to your baby at around 6 months of age. BREAST MILK PUMPING Pumping and storing breast milk allows you to ensure that your baby is exclusively fed your breast milk, even at times when you are unable to breastfeed. This is especially important if you are going back to work while you are still breastfeeding or when you are not able to be present during feedings. Your lactation consultant can give you guidelines on how long it is safe to store breast milk.  A breast pump is a machine that allows you to pump milk from your breast into a sterile bottle. The pumped breast milk can then be stored in a refrigerator or freezer. Some breast pumps are operated by hand, while others use electricity. Ask your lactation consultant which type will work best for you. Breast pumps can be purchased, but some hospitals and breastfeeding support groups lease breast pumps on a monthly basis. A lactation consultant can teach you how to hand express breast milk, if you prefer not to use a pump.  CARING FOR YOUR BREASTS WHILE YOU BREASTFEED Nipples can become dry, cracked, and sore while breastfeeding. The following recommendations can help keep your breasts moisturized and healthy:  Avoid using soap on your nipples.   Wear a supportive bra. Although not required, special nursing bras and tank tops are designed to allow access to your breasts for breastfeeding without taking off your entire bra or top. Avoid wearing underwire style bras or extremely tight bras.  Air dry your nipples for 3 4minutes after each feeding.   Use only cotton bra pads to absorb leaked breast milk. Leaking of breast milk between feedings is normal.   Use lanolin on your nipples after breastfeeding. Lanolin helps to  maintain your skin's normal moisture barrier. If you use pure lanolin you do not need to wash it off before feeding your baby again. Pure lanolin is not toxic to your baby. You may also hand express a few drops of breast milk and gently massage that milk into your nipples and allow the milk to air dry. In the first few weeks after giving birth, some women experience extremely full breasts (engorgement). Engorgement can make   your breasts feel heavy, warm, and tender to the touch. Engorgement peaks within 3 5 days after you give birth. The following recommendations can help ease engorgement:  Completely empty your breasts while breastfeeding or pumping. You may want to start by applying warm, moist heat (in the shower or with warm water-soaked hand towels) just before feeding or pumping. This increases circulation and helps the milk flow. If your baby does not completely empty your breasts while breastfeeding, pump any extra milk after he or she is finished.  Wear a snug bra (nursing or regular) or tank top for 1 2 days to signal your body to slightly decrease milk production.  Apply ice packs to your breasts, unless this is too uncomfortable for you.  Make sure that your baby is latched on and positioned properly while breastfeeding. If engorgement persists after 48 hours of following these recommendations, contact your health care provider or a Advertising copywriterlactation consultant. OVERALL HEALTH CARE RECOMMENDATIONS WHILE BREASTFEEDING  Eat healthy foods. Alternate between meals and snacks, eating 3 of each per day. Because what you eat affects your breast milk, some of the foods may make your baby more irritable than usual. Avoid eating these foods if you are sure that they are negatively affecting your baby.  Drink milk, fruit juice, and water to satisfy your thirst (about 10 glasses a day).   Rest often, relax, and continue to take your prenatal vitamins to prevent fatigue, stress, and anemia.  Continue  breast self-awareness checks.  Avoid chewing and smoking tobacco.  Avoid alcohol and drug use. Some medicines that may be harmful to your baby can pass through breast milk. It is important to ask your health care provider before taking any medicine, including all over-the-counter and prescription medicine as well as vitamin and herbal supplements. It is possible to become pregnant while breastfeeding. If birth control is desired, ask your health care provider about options that will be safe for your baby. SEEK MEDICAL CARE IF:   You feel like you want to stop breastfeeding or have become frustrated with breastfeeding.  You have painful breasts or nipples.  Your nipples are cracked or bleeding.  Your breasts are red, tender, or warm.  You have a swollen area on either breast.  You have a fever or chills.  You have nausea or vomiting.  You have drainage other than breast milk from your nipples.  Your breasts do not become full before feedings by the 5th day after you give birth.  You feel sad and depressed.  Your baby is too sleepy to eat well.  Your baby is having trouble sleeping.   Your baby is wetting less than 3 diapers in a 24-hour period.  Your baby has less than 3 stools in a 24-hour period.  Your baby's skin or the white part of his or her eyes becomes yellow.   Your baby is not gaining weight by 675 days of age. SEEK IMMEDIATE MEDICAL CARE IF:   Your baby is overly tired (lethargic) and does not want to wake up and feed.  Your baby develops an unexplained fever. Document Released: 08/22/2005 Document Revised: 04/24/2013 Document Reviewed: 02/13/2013 [facility] Patient Information 2014 [place].  Third Trimester of Pregnancy The third trimester is from week 29 through week 42, months 7 through 9. The third trimester is a time when the fetus is growing rapidly. At the end of the ninth month, the fetus is about 20 inches in length and weighs 6 10 pounds.  BODY CHANGES Your body goes through many changes during pregnancy. The changes vary from woman to woman.   Your weight will continue to increase. You can expect to gain 25 35 pounds (11 16 kg) by the end of the pregnancy.  You may begin to get stretch marks on your hips, abdomen, and breasts.  You may urinate more often because the fetus is moving lower into your pelvis and pressing on your bladder.  You may develop or continue to have heartburn as a result of your pregnancy.  You may develop constipation because certain hormones are causing the muscles that push waste through your intestines to slow down.  You may develop hemorrhoids or swollen, bulging veins (varicose veins).  You may have pelvic pain because of the weight gain and pregnancy hormones relaxing your joints between the bones in your pelvis. Back aches may result from over exertion of the muscles supporting your posture.  Your breasts will continue to grow and be tender. A yellow discharge may leak from your breasts called colostrum.  Your belly button may stick out.  You may feel short of breath because of your expanding uterus.  You may notice the fetus "dropping," or moving lower in your abdomen.  You may have a bloody mucus discharge. This usually occurs a few days to a week before labor begins.  Your cervix becomes thin and soft (effaced) near your due date. WHAT TO EXPECT AT YOUR PRENATAL EXAMS  You will have prenatal exams every 2 weeks until week 36. Then, you will have weekly prenatal exams. During a routine prenatal visit:  You will be weighed to make sure you and the fetus are growing normally.  Your blood pressure is taken.  Your abdomen will be measured to track your baby's growth.  The fetal heartbeat will be listened to.  Any test results from the previous visit will be discussed.  You may have a cervical check near your due date to see if you have effaced. At around 36 weeks, your caregiver  will check your cervix. At the same time, your caregiver will also perform a test on the secretions of the vaginal tissue. This test is to determine if a type of bacteria, Group B streptococcus, is present. Your caregiver will explain this further. Your caregiver may ask you:  What your birth plan is.  How you are feeling.  If you are feeling the baby move.  If you have had any abnormal symptoms, such as leaking fluid, bleeding, severe headaches, or abdominal cramping.  If you have any questions. Other tests or screenings that may be performed during your third trimester include:  Blood tests that check for low iron levels (anemia).  Fetal testing to check the health, activity level, and growth of the fetus. Testing is done if you have certain medical conditions or if there are problems during the pregnancy. FALSE LABOR You may feel small, irregular contractions that eventually go away. These are called Braxton Hicks contractions, or false labor. Contractions may last for hours, days, or even weeks before true labor sets in. If contractions come at regular intervals, intensify, or become painful, it is best to be seen by your caregiver.  SIGNS OF LABOR   Menstrual-like cramps.  Contractions that are 5 minutes apart or less.  Contractions that start on the top of the uterus and spread down to the lower abdomen and back.  A sense of increased pelvic pressure or back pain.  A watery or bloody mucus  discharge that comes from the vagina. If you have any of these signs before the 37th week of pregnancy, call your caregiver right away. You need to go to the hospital to get checked immediately. HOME CARE INSTRUCTIONS   Avoid all smoking, herbs, alcohol, and unprescribed drugs. These chemicals affect the formation and growth of the baby.  Follow your caregiver's instructions regarding medicine use. There are medicines that are either safe or unsafe to take during pregnancy.  Exercise only  as directed by your caregiver. Experiencing uterine cramps is a good sign to stop exercising.  Continue to eat regular, healthy meals.  Wear a good support bra for breast tenderness.  Do not use hot tubs, steam rooms, or saunas.  Wear your seat belt at all times when driving.  Avoid raw meat, uncooked cheese, cat litter boxes, and soil used by cats. These carry germs that can cause birth defects in the baby.  Take your prenatal vitamins.  Try taking a stool softener (if your caregiver approves) if you develop constipation. Eat more high-fiber foods, such as fresh vegetables or fruit and whole grains. Drink plenty of fluids to keep your urine clear or pale yellow.  Take warm sitz baths to soothe any pain or discomfort caused by hemorrhoids. Use hemorrhoid cream if your caregiver approves.  If you develop varicose veins, wear support hose. Elevate your feet for 15 minutes, 3 4 times a day. Limit salt in your diet.  Avoid heavy lifting, wear low heal shoes, and practice good posture.  Rest a lot with your legs elevated if you have leg cramps or low back pain.  Visit your dentist if you have not gone during your pregnancy. Use a soft toothbrush to brush your teeth and be gentle when you floss.  A sexual relationship may be continued unless your caregiver directs you otherwise.  Do not travel far distances unless it is absolutely necessary and only with the approval of your caregiver.  Take prenatal classes to understand, practice, and ask questions about the labor and delivery.  Make a trial run to the hospital.  Pack your hospital bag.  Prepare the baby's nursery.  Continue to go to all your prenatal visits as directed by your caregiver. SEEK MEDICAL CARE IF:  You are unsure if you are in labor or if your water has broken.  You have dizziness.  You have mild pelvic cramps, pelvic pressure, or nagging pain in your abdominal area.  You have persistent nausea, vomiting, or  diarrhea.  You have a bad smelling vaginal discharge.  You have pain with urination. SEEK IMMEDIATE MEDICAL CARE IF:   You have a fever.  You are leaking fluid from your vagina.  You have spotting or bleeding from your vagina.  You have severe abdominal cramping or pain.  You have rapid weight loss or gain.  You have shortness of breath with chest pain.  You notice sudden or extreme swelling of your face, hands, ankles, feet, or legs.  You have not felt your baby move in over an hour.  You have severe headaches that do not go away with medicine.  You have vision changes. Document Released: 08/16/2001 Document Revised: 04/24/2013 Document Reviewed: 10/23/2012 [facility] Patient Information 2014 [place], [place].

## 2014-01-07 LAB — RPR

## 2014-01-07 LAB — GLUCOSE TOLERANCE, 1 HOUR (50G) W/O FASTING: Glucose, 1 Hour GTT: 95 mg/dL (ref 70–140)

## 2014-01-07 LAB — HIV ANTIBODY (ROUTINE TESTING W REFLEX): HIV: NONREACTIVE

## 2014-01-16 ENCOUNTER — Encounter: Payer: Self-pay | Admitting: Advanced Practice Midwife

## 2014-01-20 ENCOUNTER — Ambulatory Visit (INDEPENDENT_AMBULATORY_CARE_PROVIDER_SITE_OTHER): Payer: 59 | Admitting: Obstetrics and Gynecology

## 2014-01-20 ENCOUNTER — Encounter: Payer: Self-pay | Admitting: Obstetrics and Gynecology

## 2014-01-20 VITALS — BP 106/68 | HR 92 | Wt 161.0 lb

## 2014-01-20 DIAGNOSIS — Z3480 Encounter for supervision of other normal pregnancy, unspecified trimester: Secondary | ICD-10-CM

## 2014-01-20 NOTE — Progress Notes (Signed)
Doing well. 28 labs normal. Certificate for waterbirth calss completion> bring in to scan next visit, then do consents.

## 2014-01-20 NOTE — Patient Instructions (Signed)
Third Trimester of Pregnancy  The third trimester is from week 29 through week 42, months 7 through 9. The third trimester is a time when the fetus is growing rapidly. At the end of the ninth month, the fetus is about 20 inches in length and weighs 6 10 pounds.   BODY CHANGES  Your body goes through many changes during pregnancy. The changes vary from woman to woman.    Your weight will continue to increase. You can expect to gain 25 35 pounds (11 16 kg) by the end of the pregnancy.   You may begin to get stretch marks on your hips, abdomen, and breasts.   You may urinate more often because the fetus is moving lower into your pelvis and pressing on your bladder.   You may develop or continue to have heartburn as a result of your pregnancy.   You may develop constipation because certain hormones are causing the muscles that push waste through your intestines to slow down.   You may develop hemorrhoids or swollen, bulging veins (varicose veins).   You may have pelvic pain because of the weight gain and pregnancy hormones relaxing your joints between the bones in your pelvis. Back aches may result from over exertion of the muscles supporting your posture.   Your breasts will continue to grow and be tender. A yellow discharge may leak from your breasts called colostrum.   Your belly button may stick out.   You may feel short of breath because of your expanding uterus.   You may notice the fetus "dropping," or moving lower in your abdomen.   You may have a bloody mucus discharge. This usually occurs a few days to a week before labor begins.   Your cervix becomes thin and soft (effaced) near your due date.  WHAT TO EXPECT AT YOUR PRENATAL EXAMS   You will have prenatal exams every 2 weeks until week 36. Then, you will have weekly prenatal exams. During a routine prenatal visit:   You will be weighed to make sure you and the fetus are growing normally.   Your blood pressure is taken.   Your abdomen will be  measured to track your baby's growth.   The fetal heartbeat will be listened to.   Any test results from the previous visit will be discussed.   You may have a cervical check near your due date to see if you have effaced.  At around 36 weeks, your caregiver will check your cervix. At the same time, your caregiver will also perform a test on the secretions of the vaginal tissue. This test is to determine if a type of bacteria, Group B streptococcus, is present. Your caregiver will explain this further.  Your caregiver may ask you:   What your birth plan is.   How you are feeling.   If you are feeling the baby move.   If you have had any abnormal symptoms, such as leaking fluid, bleeding, severe headaches, or abdominal cramping.   If you have any questions.  Other tests or screenings that may be performed during your third trimester include:   Blood tests that check for low iron levels (anemia).   Fetal testing to check the health, activity level, and growth of the fetus. Testing is done if you have certain medical conditions or if there are problems during the pregnancy.  FALSE LABOR  You may feel small, irregular contractions that eventually go away. These are called Braxton Hicks contractions, or   false labor. Contractions may last for hours, days, or even weeks before true labor sets in. If contractions come at regular intervals, intensify, or become painful, it is best to be seen by your caregiver.   SIGNS OF LABOR    Menstrual-like cramps.   Contractions that are 5 minutes apart or less.   Contractions that start on the top of the uterus and spread down to the lower abdomen and back.   A sense of increased pelvic pressure or back pain.   A watery or bloody mucus discharge that comes from the vagina.  If you have any of these signs before the 37th week of pregnancy, call your caregiver right away. You need to go to the hospital to get checked immediately.  HOME CARE INSTRUCTIONS    Avoid all  smoking, herbs, alcohol, and unprescribed drugs. These chemicals affect the formation and growth of the baby.   Follow your caregiver's instructions regarding medicine use. There are medicines that are either safe or unsafe to take during pregnancy.   Exercise only as directed by your caregiver. Experiencing uterine cramps is a good sign to stop exercising.   Continue to eat regular, healthy meals.   Wear a good support bra for breast tenderness.   Do not use hot tubs, steam rooms, or saunas.   Wear your seat belt at all times when driving.   Avoid raw meat, uncooked cheese, cat litter boxes, and soil used by cats. These carry germs that can cause birth defects in the baby.   Take your prenatal vitamins.   Try taking a stool softener (if your caregiver approves) if you develop constipation. Eat more high-fiber foods, such as fresh vegetables or fruit and whole grains. Drink plenty of fluids to keep your urine clear or pale yellow.   Take warm sitz baths to soothe any pain or discomfort caused by hemorrhoids. Use hemorrhoid cream if your caregiver approves.   If you develop varicose veins, wear support hose. Elevate your feet for 15 minutes, 3 4 times a day. Limit salt in your diet.   Avoid heavy lifting, wear low heal shoes, and practice good posture.   Rest a lot with your legs elevated if you have leg cramps or low back pain.   Visit your dentist if you have not gone during your pregnancy. Use a soft toothbrush to brush your teeth and be gentle when you floss.   A sexual relationship may be continued unless your caregiver directs you otherwise.   Do not travel far distances unless it is absolutely necessary and only with the approval of your caregiver.   Take prenatal classes to understand, practice, and ask questions about the labor and delivery.   Make a trial run to the hospital.   Pack your hospital bag.   Prepare the baby's nursery.   Continue to go to all your prenatal visits as directed  by your caregiver.  SEEK MEDICAL CARE IF:   You are unsure if you are in labor or if your water has broken.   You have dizziness.   You have mild pelvic cramps, pelvic pressure, or nagging pain in your abdominal area.   You have persistent nausea, vomiting, or diarrhea.   You have a bad smelling vaginal discharge.   You have pain with urination.  SEEK IMMEDIATE MEDICAL CARE IF:    You have a fever.   You are leaking fluid from your vagina.   You have spotting or bleeding from your vagina.     You have severe abdominal cramping or pain.   You have rapid weight loss or gain.   You have shortness of breath with chest pain.   You notice sudden or extreme swelling of your face, hands, ankles, feet, or legs.   You have not felt your baby move in over an hour.   You have severe headaches that do not go away with medicine.   You have vision changes.  Document Released: 08/16/2001 Document Revised: 04/24/2013 Document Reviewed: 10/23/2012  [employer] Patient Information 2014 [employer].

## 2014-02-03 ENCOUNTER — Encounter: Payer: Self-pay | Admitting: *Deleted

## 2014-02-03 ENCOUNTER — Encounter: Payer: Self-pay | Admitting: Advanced Practice Midwife

## 2014-02-03 ENCOUNTER — Ambulatory Visit (INDEPENDENT_AMBULATORY_CARE_PROVIDER_SITE_OTHER): Payer: 59 | Admitting: Advanced Practice Midwife

## 2014-02-03 VITALS — BP 116/77 | HR 102 | Wt 162.0 lb

## 2014-02-03 DIAGNOSIS — Z3480 Encounter for supervision of other normal pregnancy, unspecified trimester: Secondary | ICD-10-CM

## 2014-02-03 NOTE — Progress Notes (Signed)
Reviewed 28 week labs. Discussed waterbirth study. Waterbirth consent and study consents signed.

## 2014-02-03 NOTE — Patient Instructions (Signed)
Braxton Hicks Contractions Pregnancy is commonly associated with contractions of the uterus throughout the pregnancy. Towards the end of pregnancy (32 to 34 weeks), these contractions (Braxton Hicks) can develop more often and may become more forceful. This is not true labor because these contractions do not result in opening (dilatation) and thinning of the cervix. They are sometimes difficult to tell apart from true labor because these contractions can be forceful and people have different pain tolerances. You should not feel embarrassed if you go to the hospital with false labor. Sometimes, the only way to tell if you are in true labor is for your caregiver to follow the changes in the cervix. How to tell the difference between true and false labor:  False labor.  The contractions of false labor are usually shorter, irregular and not as hard as those of true labor.  They are often felt in the front of the lower abdomen and in the groin.  They may leave with walking around or changing positions while lying down.  They get weaker and are shorter lasting as time goes on.  These contractions are usually irregular.  They do not usually become progressively stronger, regular and closer together as with true labor.  True labor.  Contractions in true labor last 30 to 70 seconds, become very regular, usually become more intense, and increase in frequency.  They do not go away with walking.  The discomfort is usually felt in the top of the uterus and spreads to the lower abdomen and low back.  True labor can be determined by your caregiver with an exam. This will show that the cervix is dilating and getting thinner. If there are no prenatal problems or other health problems associated with the pregnancy, it is completely safe to be sent home with false labor and await the onset of true labor. HOME CARE INSTRUCTIONS   Keep up with your usual exercises and instructions.  Take medications as  directed.  Keep your regular prenatal appointment.  Eat and drink lightly if you think you are going into labor.  If BH contractions are making you uncomfortable:  Change your activity position from lying down or resting to walking/walking to resting.  Sit and rest in a tub of warm water.  Drink 2 to 3 glasses of water. Dehydration may cause B-H contractions.  Do slow and deep breathing several times an hour. SEEK IMMEDIATE MEDICAL CARE IF:   Your contractions continue to become stronger, more regular, and closer together.  You have a gushing, burst or leaking of fluid from the vagina.  An oral temperature above 102 F (38.9 C) develops.  You have passage of blood-tinged mucus.  You develop vaginal bleeding.  You develop continuous belly (abdominal) pain.  You have low back pain that you never had before.  You feel the baby's head pushing down causing pelvic pressure.  The baby is not moving as much as it used to. Document Released: 08/22/2005 Document Revised: 11/14/2011 Document Reviewed: 06/03/2013 [employer] Patient Information 2014 [employer]  Fetal Movement Counts Patient Name: __________________________________________________ Patient Due Date: ____________________ Performing a fetal movement count is highly recommended in high-risk pregnancies, but it is good for every pregnant woman to do. Your caregiver may ask you to start counting fetal movements at 28 weeks of the pregnancy. Fetal movements often increase:  After eating a full meal.  After physical activity.  After eating or drinking something sweet or cold.  At rest. Pay attention to when you feel   the baby is most active. This will help you notice a pattern of your baby's sleep and wake cycles and what factors contribute to an increase in fetal movement. It is important to perform a fetal movement count at the same time each day when your baby is normally most active.  HOW TO COUNT FETAL  MOVEMENTS 1. Find a quiet and comfortable area to sit or lie down on your left side. Lying on your left side provides the best blood and oxygen circulation to your baby. 2. Write down the day and time on a sheet of paper or in a journal. 3. Start counting kicks, flutters, swishes, rolls, or jabs in a 2 hour period. You should feel at least 10 movements within 2 hours. 4. If you do not feel 10 movements in 2 hours, wait 2 3 hours and count again. Look for a change in the pattern or not enough counts in 2 hours. SEEK MEDICAL CARE IF:  You feel less than 10 counts in 2 hours, tried twice.  There is no movement in over an hour.  The pattern is changing or taking longer each day to reach 10 counts in 2 hours.  You feel the baby is not moving as he or she usually does. Date: ____________ Movements: ____________ Start time: ____________ Finish time: ____________  Date: ____________ Movements: ____________ Start time: ____________ Finish time: ____________ Date: ____________ Movements: ____________ Start time: ____________ Finish time: ____________ Date: ____________ Movements: ____________ Start time: ____________ Finish time: ____________ Date: ____________ Movements: ____________ Start time: ____________ Finish time: ____________ Date: ____________ Movements: ____________ Start time: ____________ Finish time: ____________ Date: ____________ Movements: ____________ Start time: ____________ Finish time: ____________ Date: ____________ Movements: ____________ Start time: ____________ Finish time: ____________  Date: ____________ Movements: ____________ Start time: ____________ Finish time: ____________ Date: ____________ Movements: ____________ Start time: ____________ Finish time: ____________ Date: ____________ Movements: ____________ Start time: ____________ Finish time: ____________ Date: ____________ Movements: ____________ Start time: ____________ Finish time: ____________ Date: ____________  Movements: ____________ Start time: ____________ Finish time: ____________ Date: ____________ Movements: ____________ Start time: ____________ Finish time: ____________ Date: ____________ Movements: ____________ Start time: ____________ Finish time: ____________  Date: ____________ Movements: ____________ Start time: ____________ Finish time: ____________ Date: ____________ Movements: ____________ Start time: ____________ Finish time: ____________ Date: ____________ Movements: ____________ Start time: ____________ Finish time: ____________ Date: ____________ Movements: ____________ Start time: ____________ Finish time: ____________ Date: ____________ Movements: ____________ Start time: ____________ Finish time: ____________ Date: ____________ Movements: ____________ Start time: ____________ Finish time: ____________ Date: ____________ Movements: ____________ Start time: ____________ Finish time: ____________  Date: ____________ Movements: ____________ Start time: ____________ Finish time: ____________ Date: ____________ Movements: ____________ Start time: ____________ Finish time: ____________ Date: ____________ Movements: ____________ Start time: ____________ Finish time: ____________ Date: ____________ Movements: ____________ Start time: ____________ Finish time: ____________ Date: ____________ Movements: ____________ Start time: ____________ Finish time: ____________ Date: ____________ Movements: ____________ Start time: ____________ Finish time: ____________ Date: ____________ Movements: ____________ Start time: ____________ Finish time: ____________  Date: ____________ Movements: ____________ Start time: ____________ Finish time: ____________ Date: ____________ Movements: ____________ Start time: ____________ Finish time: ____________ Date: ____________ Movements: ____________ Start time: ____________ Finish time: ____________ Date: ____________ Movements: ____________ Start time:  ____________ Finish time: ____________ Date: ____________ Movements: ____________ Start time: ____________ Finish time: ____________ Date: ____________ Movements: ____________ Start time: ____________ Finish time: ____________ Date: ____________ Movements: ____________ Start time: ____________ Finish time: ____________  Date: ____________ Movements: ____________ Start time: ____________ Finish time: ____________ Date: ____________ Movements: ____________ Start   time: ____________ Finish time: ____________ Date: ____________ Movements: ____________ Start time: ____________ Finish time: ____________ Date: ____________ Movements: ____________ Start time: ____________ Finish time: ____________ Date: ____________ Movements: ____________ Start time: ____________ Finish time: ____________ Date: ____________ Movements: ____________ Start time: ____________ Finish time: ____________ Date: ____________ Movements: ____________ Start time: ____________ Finish time: ____________  Date: ____________ Movements: ____________ Start time: ____________ Finish time: ____________ Date: ____________ Movements: ____________ Start time: ____________ Finish time: ____________ Date: ____________ Movements: ____________ Start time: ____________ Finish time: ____________ Date: ____________ Movements: ____________ Start time: ____________ Finish time: ____________ Date: ____________ Movements: ____________ Start time: ____________ Finish time: ____________ Date: ____________ Movements: ____________ Start time: ____________ Finish time: ____________ Date: ____________ Movements: ____________ Start time: ____________ Finish time: ____________  Date: ____________ Movements: ____________ Start time: ____________ Finish time: ____________ Date: ____________ Movements: ____________ Start time: ____________ Finish time: ____________ Date: ____________ Movements: ____________ Start time: ____________ Finish time: ____________ Date:  ____________ Movements: ____________ Start time: ____________ Finish time: ____________ Date: ____________ Movements: ____________ Start time: ____________ Finish time: ____________ Date: ____________ Movements: ____________ Start time: ____________ Finish time: ____________ Document Released: 09/21/2006 Document Revised: 08/08/2012 Document Reviewed: 06/18/2012 [employer] Patient Information 2014 [employer].  

## 2014-02-14 ENCOUNTER — Encounter: Payer: Self-pay | Admitting: Family Medicine

## 2014-02-14 ENCOUNTER — Ambulatory Visit (INDEPENDENT_AMBULATORY_CARE_PROVIDER_SITE_OTHER): Payer: 59 | Admitting: Family Medicine

## 2014-02-14 VITALS — BP 114/69 | HR 88 | Wt 165.0 lb

## 2014-02-14 DIAGNOSIS — B081 Molluscum contagiosum: Secondary | ICD-10-CM

## 2014-02-14 NOTE — Progress Notes (Signed)
   Subjective:    Patient ID: [name] Green, female    DOB: 05/14/1981, 33 y.o.   MRN: [id number]  HPI bump on back noticed about 1 wk ago has not treated w/anything. One of her other children has had molluscum   Review of Systems     Objective:   Physical Exam  She has a donut-shaped pink papular lesion with a core on the right upper back near the shoulder blade.      Assessment & Plan:  Molluscum contagiosum-single lesion. Cryotherapy performed the patient tolerated well today.  Cryotherapy Procedure Note  Pre-operative Diagnosis: Molluscum contagiosum   right upper back  Indications: viral   Anesthesia: not required    Procedure Details   Patient informed of risks (permanent scarring, infection, light or dark discoloration, bleeding, infection, weakness, numbness and recurrence of the lesion) and benefits of the procedure and verbal informed consent obtained.  The areas are treated with liquid nitrogen therapy, frozen until ice ball extended 1 mm beyond lesion, allowed to thaw, and treated again. The patient tolerated procedure well.  The patient was instructed on post-op care, warned that there may be blister formation, redness and pain. Recommend OTC analgesia as needed for pain.  Condition: Stable  Complications: none.  Plan: 1. Instructed to keep the area dry and covered for 24-48h and clean thereafter. 2. Warning signs of infection were reviewed.   3. Recommended that the patient use OTC acetaminophen as needed for pain.  4. Return prn.

## 2014-02-24 ENCOUNTER — Ambulatory Visit (INDEPENDENT_AMBULATORY_CARE_PROVIDER_SITE_OTHER): Payer: 59 | Admitting: Advanced Practice Midwife

## 2014-02-24 ENCOUNTER — Encounter: Payer: Self-pay | Admitting: *Deleted

## 2014-02-24 VITALS — BP 111/71 | HR 83 | Wt 166.0 lb

## 2014-02-24 DIAGNOSIS — R197 Diarrhea, unspecified: Secondary | ICD-10-CM

## 2014-02-24 DIAGNOSIS — Z3480 Encounter for supervision of other normal pregnancy, unspecified trimester: Secondary | ICD-10-CM

## 2014-02-24 DIAGNOSIS — Z3483 Encounter for supervision of other normal pregnancy, third trimester: Secondary | ICD-10-CM

## 2014-02-24 NOTE — Patient Instructions (Signed)
Food Choices to Help Relieve Diarrhea When you have diarrhea, the foods you eat and your eating habits are very important. Choosing the right foods and drinks can help relieve diarrhea. Also, because diarrhea can last up to 7 days, you need to replace lost fluids and electrolytes (such as sodium, potassium, and chloride) in order to help prevent dehydration.  WHAT GENERAL GUIDELINES DO I NEED TO FOLLOW?  Slowly drink 1 cup (8 oz) of fluid for each episode of diarrhea. If you are getting enough fluid, your urine will be clear or pale yellow.  Eat starchy foods. Some good choices include white rice, white toast, pasta, low-fiber cereal, baked potatoes (without the skin), saltine crackers, and bagels.  Avoid large servings of any cooked vegetables.  Limit fruit to two servings per day. A serving is  cup or 1 small piece.  Choose foods with less than 2 g of fiber per serving.  Limit fats to less than 8 tsp (38 g) per day.  Avoid fried foods.  Eat foods that have probiotics in them. Probiotics can be found in certain dairy products.  Avoid foods and beverages that may increase the speed at which food moves through the stomach and intestines (gastrointestinal tract). Things to avoid include:  High-fiber foods, such as dried fruit, raw fruits and vegetables, nuts, seeds, and whole grain foods.  Spicy foods and high-fat foods.  Foods and beverages sweetened with high-fructose corn syrup, honey, or sugar alcohols such as xylitol, sorbitol, and mannitol. WHAT FOODS ARE RECOMMENDED? Grains White rice. White, French, or pita breads (fresh or toasted), including plain rolls, buns, or bagels. White pasta. Saltine, soda, or graham crackers. Pretzels. Low-fiber cereal. Cooked cereals made with water (such as cornmeal, farina, or cream cereals). Plain muffins. Matzo. Melba toast. Zwieback.  Vegetables Potatoes (without the skin). Strained tomato and vegetable juices. Most well-cooked and canned  vegetables without seeds. Tender lettuce. Fruits Cooked or canned applesauce, apricots, cherries, fruit cocktail, grapefruit, peaches, pears, or plums. Fresh bananas, apples without skin, cherries, grapes, cantaloupe, grapefruit, peaches, oranges, or plums.  Meat and Other Protein Products Baked or boiled chicken. Eggs. Tofu. Fish. Seafood. Smooth peanut butter. Ground or well-cooked tender beef, ham, veal, lamb, pork, or poultry.  Dairy Plain yogurt, kefir, and unsweetened liquid yogurt. Lactose-free milk, buttermilk, or soy milk. Plain hard cheese. Beverages Sport drinks. Clear broths. Diluted fruit juices (except prune). Regular, caffeine-free sodas such as ginger ale. Water. Decaffeinated teas. Oral rehydration solutions. Sugar-free beverages not sweetened with sugar alcohols. Other Bouillon, broth, or soups made from recommended foods.  The items listed above may not be a complete list of recommended foods or beverages. Contact your dietitian for more options. WHAT FOODS ARE NOT RECOMMENDED? Grains Whole grain, whole wheat, bran, or rye breads, rolls, pastas, crackers, and cereals. Wild or brown rice. Cereals that contain more than 2 g of fiber per serving. Corn tortillas or taco shells. Cooked or dry oatmeal. Granola. Popcorn. Vegetables Raw vegetables. Cabbage, broccoli, Brussels sprouts, artichokes, baked beans, beet greens, corn, kale, legumes, peas, sweet potatoes, and yams. Potato skins. Cooked spinach and cabbage. Fruits Dried fruit, including raisins and dates. Raw fruits. Stewed or dried prunes. Fresh apples with skin, apricots, mangoes, pears, raspberries, and strawberries.  Meat and Other Protein Products Chunky peanut butter. Nuts and seeds. Beans and lentils. Bacon.  Dairy High-fat cheeses. Milk, chocolate milk, and beverages made with milk, such as milk shakes. Cream. Ice cream. Sweets and Desserts Sweet rolls, doughnuts, and sweet breads. Pancakes   and waffles. Fats and  Oils Butter. Cream sauces. Margarine. Salad oils. Plain salad dressings. Olives. Avocados.  Beverages Caffeinated beverages (such as coffee, tea, soda, or energy drinks). Alcoholic beverages. Fruit juices with pulp. Prune juice. Soft drinks sweetened with high-fructose corn syrup or sugar alcohols. Other Coconut. Hot sauce. Chili powder. Mayonnaise. Gravy. Cream-based or milk-based soups.  The items listed above may not be a complete list of foods and beverages to avoid. Contact your dietitian for more information. WHAT SHOULD I DO IF I BECOME DEHYDRATED? Diarrhea can sometimes lead to dehydration. Signs of dehydration include dark urine and dry mouth and skin. If you think you are dehydrated, you should rehydrate with an oral rehydration solution. These solutions can be purchased at pharmacies, retail stores, or online.  Drink -1 cup (120-240 mL) of oral rehydration solution each time you have an episode of diarrhea. If drinking this amount makes your diarrhea worse, try drinking smaller amounts more often. For example, drink 1-3 tsp (5-15 mL) every 5-10 minutes.  A general rule for staying hydrated is to drink 1-2 L of fluid per day. Talk to your health care provider about the specific amount you should be drinking each day. Drink enough fluids to keep your urine clear or pale yellow. Document Released: 11/12/2003 Document Revised: 08/27/2013 Document Reviewed: 07/15/2013 [facility] Patient Information 2015 [place], [place]. This information is not intended to replace advice given to you by your health care provider. Make sure you discuss any questions you have with your health care provider.  Braxton Hicks Contractions Contractions of the uterus can occur throughout pregnancy. Contractions are not always a sign that you are in labor.  WHAT ARE BRAXTON HICKS CONTRACTIONS?  Contractions that occur before labor are called Braxton Hicks contractions, or false labor. Toward the end of pregnancy (32-34  weeks), these contractions can develop more often and may become more forceful. This is not true labor because these contractions do not result in opening (dilatation) and thinning of the cervix. They are sometimes difficult to tell apart from true labor because these contractions can be forceful and people have different pain tolerances. You should not feel embarrassed if you go to the hospital with false labor. Sometimes, the only way to tell if you are in true labor is for your health care provider to look for changes in the cervix. If there are no prenatal problems or other health problems associated with the pregnancy, it is completely safe to be sent home with false labor and await the onset of true labor. HOW CAN YOU TELL THE DIFFERENCE BETWEEN TRUE AND FALSE LABOR? False Labor  The contractions of false labor are usually shorter and not as hard as those of true labor.   The contractions are usually irregular.   The contractions are often felt in the front of the lower abdomen and in the groin.   The contractions may go away when you walk around or change positions while lying down.   The contractions get weaker and are shorter lasting as time goes on.   The contractions do not usually become progressively stronger, regular, and closer together as with true labor.  True Labor  Contractions in true labor last 30-70 seconds, become very regular, usually become more intense, and increase in frequency.   The contractions do not go away with walking.   The discomfort is usually felt in the top of the uterus and spreads to the lower abdomen and low back.   True labor can be  determined by your health care provider with an exam. This will show that the cervix is dilating and getting thinner.  WHAT TO REMEMBER  Keep up with your usual exercises and follow other instructions given by your health care provider.   Take medicines as directed by your health care provider.   Keep  your regular prenatal appointments.   Eat and drink lightly if you think you are going into labor.   If Braxton Hicks contractions are making you uncomfortable:   Change your position from lying down or resting to walking, or from walking to resting.   Sit and rest in a tub of warm water.   Drink 2-3 glasses of water. Dehydration may cause these contractions.   Do slow and deep breathing several times an hour.  WHEN SHOULD I SEEK IMMEDIATE MEDICAL CARE? Seek immediate medical care if:  Your contractions become stronger, more regular, and closer together.   You have fluid leaking or gushing from your vagina.   You have a fever.   You pass blood-tinged mucus.   You have vaginal bleeding.   You have continuous abdominal pain.   You have low back pain that you never had before.   You feel your baby's head pushing down and causing pelvic pressure.   Your baby is not moving as much as it used to.  Document Released: 08/22/2005 Document Revised: 08/27/2013 Document Reviewed: 06/03/2013 [facility] Patient Information 2015 [place], [place]. This information is not intended to replace advice given to you by your health care provider. Make sure you discuss any questions you have with your health care provider.

## 2014-02-24 NOTE — Progress Notes (Signed)
Active baby. Increased BH. Loose stools 1-3 x per day. No fever, N/V, sick contacts or risk factors for food-borne illness, C.dif or parasites. May be related to contractions. Recommend binding foods. Consider cultures if worsening Sx.

## 2014-03-03 ENCOUNTER — Ambulatory Visit (INDEPENDENT_AMBULATORY_CARE_PROVIDER_SITE_OTHER): Payer: 59 | Admitting: Obstetrics & Gynecology

## 2014-03-03 VITALS — BP 123/71 | HR 88 | Wt 167.0 lb

## 2014-03-03 DIAGNOSIS — Z113 Encounter for screening for infections with a predominantly sexual mode of transmission: Secondary | ICD-10-CM

## 2014-03-03 DIAGNOSIS — Z3480 Encounter for supervision of other normal pregnancy, unspecified trimester: Secondary | ICD-10-CM

## 2014-03-03 DIAGNOSIS — Z3483 Encounter for supervision of other normal pregnancy, third trimester: Secondary | ICD-10-CM

## 2014-03-03 DIAGNOSIS — Z3685 Encounter for antenatal screening for Streptococcus B: Secondary | ICD-10-CM

## 2014-03-03 LAB — OB RESULTS CONSOLE GBS: GBS: NEGATIVE

## 2014-03-03 NOTE — Progress Notes (Signed)
Pelvic cultures done today.  No other complaints or concerns.  Fetal movement and labor precautions reviewed. 

## 2014-03-03 NOTE — Patient Instructions (Signed)
Return to clinic for any obstetric concerns or go to MAU for evaluation  

## 2014-03-04 LAB — GC/CHLAMYDIA PROBE AMP
CT PROBE, AMP APTIMA: NEGATIVE
GC PROBE AMP APTIMA: NEGATIVE

## 2014-03-05 LAB — CULTURE, STREPTOCOCCUS GRP B W/SUSCEPT

## 2014-03-06 ENCOUNTER — Encounter: Payer: Self-pay | Admitting: Obstetrics & Gynecology

## 2014-03-11 ENCOUNTER — Encounter (HOSPITAL_COMMUNITY): Payer: Self-pay | Admitting: *Deleted

## 2014-03-11 ENCOUNTER — Inpatient Hospital Stay (HOSPITAL_COMMUNITY)
Admission: AD | Admit: 2014-03-11 | Discharge: 2014-03-13 | DRG: 775 | Disposition: A | Discharge status: Home / Self Care | Payer: 59 | Source: Ambulatory Visit | Attending: Obstetrics & Gynecology | Admitting: Obstetrics & Gynecology

## 2014-03-11 ENCOUNTER — Encounter: Payer: 59 | Admitting: Obstetrics & Gynecology

## 2014-03-11 ENCOUNTER — Inpatient Hospital Stay (HOSPITAL_COMMUNITY)
Admission: AD | Admit: 2014-03-11 | Discharge: 2014-03-11 | Disposition: A | Discharge status: Home / Self Care | Payer: 59 | Source: Ambulatory Visit | Attending: Obstetrics & Gynecology | Admitting: Obstetrics & Gynecology

## 2014-03-11 DIAGNOSIS — Z823 Family history of stroke: Secondary | ICD-10-CM | POA: Diagnosis not present

## 2014-03-11 DIAGNOSIS — O479 False labor, unspecified: Secondary | ICD-10-CM | POA: Insufficient documentation

## 2014-03-11 DIAGNOSIS — D696 Thrombocytopenia, unspecified: Secondary | ICD-10-CM | POA: Diagnosis present

## 2014-03-11 DIAGNOSIS — J45909 Unspecified asthma, uncomplicated: Secondary | ICD-10-CM | POA: Diagnosis present

## 2014-03-11 DIAGNOSIS — Z833 Family history of diabetes mellitus: Secondary | ICD-10-CM | POA: Diagnosis not present

## 2014-03-11 DIAGNOSIS — Z801 Family history of malignant neoplasm of trachea, bronchus and lung: Secondary | ICD-10-CM | POA: Diagnosis not present

## 2014-03-11 DIAGNOSIS — Z3483 Encounter for supervision of other normal pregnancy, third trimester: Secondary | ICD-10-CM

## 2014-03-11 DIAGNOSIS — O4290 Premature rupture of membranes, unspecified as to length of time between rupture and onset of labor, unspecified weeks of gestation: Principal | ICD-10-CM

## 2014-03-11 DIAGNOSIS — D689 Coagulation defect, unspecified: Secondary | ICD-10-CM | POA: Diagnosis present

## 2014-03-11 DIAGNOSIS — O99891 Other specified diseases and conditions complicating pregnancy: Secondary | ICD-10-CM | POA: Diagnosis present

## 2014-03-11 DIAGNOSIS — O9912 Other diseases of the blood and blood-forming organs and certain disorders involving the immune mechanism complicating childbirth: Secondary | ICD-10-CM

## 2014-03-11 HISTORY — DX: Other specified health status: Z78.9

## 2014-03-11 MED ORDER — IBUPROFEN 600 MG PO TABS: 600.0000 mg | ORAL_TABLET | Freq: Four times a day (QID) | ORAL | Status: DC | PRN

## 2014-03-11 MED ORDER — LACTATED RINGERS IV SOLN
INTRAVENOUS | Status: DC
  Administered 2014-03-12: 04:00:00 via INTRAVENOUS

## 2014-03-11 MED ORDER — CITRIC ACID-SODIUM CITRATE 334-500 MG/5ML PO SOLN: 30.0000 mL | ORAL | Status: DC | PRN

## 2014-03-11 MED ORDER — OXYTOCIN BOLUS FROM INFUSION
500.0000 mL | INTRAVENOUS | Status: DC
  Administered 2014-03-12: 500 mL via INTRAVENOUS

## 2014-03-11 MED ORDER — OXYCODONE-ACETAMINOPHEN 5-325 MG PO TABS: 1.0000 | ORAL_TABLET | ORAL | Status: DC | PRN

## 2014-03-11 MED ORDER — ACETAMINOPHEN 325 MG PO TABS: 650.0000 mg | ORAL_TABLET | ORAL | Status: DC | PRN

## 2014-03-11 MED ORDER — LACTATED RINGERS IV SOLN
500.0000 mL | INTRAVENOUS | Status: DC | PRN
  Administered 2014-03-12: 400 mL via INTRAVENOUS

## 2014-03-11 MED ORDER — ONDANSETRON HCL 4 MG/2ML IJ SOLN: 4.0000 mg | Freq: Four times a day (QID) | INTRAMUSCULAR | Status: DC | PRN

## 2014-03-11 MED ORDER — OXYTOCIN 40 UNITS IN LACTATED RINGERS INFUSION - SIMPLE MED
62.5000 mL/h | INTRAVENOUS | Status: DC
  Filled 2014-03-11: qty 1000

## 2014-03-11 MED ORDER — FENTANYL CITRATE 0.05 MG/ML IJ SOLN: 100.0000 ug | INTRAMUSCULAR | Status: DC | PRN

## 2014-03-11 MED ORDER — LIDOCAINE HCL (PF) 1 % IJ SOLN
30.0000 mL | INTRAMUSCULAR | Status: DC | PRN
  Filled 2014-03-11: qty 30

## 2014-03-11 NOTE — MAU Note (Signed)
PT   SAYS S HE THINKS S SROM  730PM--   WAS 2-3 CM IN OFFICE  IN [place]  TODAY.    GBS- NEG.   DENIES HSV AND MRSA.

## 2014-03-11 NOTE — MAU Note (Signed)
Pt reports leaking fluid since 1930, contractions since this am.

## 2014-03-12 ENCOUNTER — Encounter (HOSPITAL_COMMUNITY): Payer: 59 | Admitting: Anesthesiology

## 2014-03-12 ENCOUNTER — Encounter (HOSPITAL_COMMUNITY): Payer: Self-pay | Admitting: *Deleted

## 2014-03-12 ENCOUNTER — Inpatient Hospital Stay (HOSPITAL_COMMUNITY): Payer: 59 | Admitting: Anesthesiology

## 2014-03-12 DIAGNOSIS — O4290 Premature rupture of membranes, unspecified as to length of time between rupture and onset of labor, unspecified weeks of gestation: Secondary | ICD-10-CM

## 2014-03-12 DIAGNOSIS — O9912 Other diseases of the blood and blood-forming organs and certain disorders involving the immune mechanism complicating childbirth: Secondary | ICD-10-CM

## 2014-03-12 DIAGNOSIS — D689 Coagulation defect, unspecified: Secondary | ICD-10-CM

## 2014-03-12 DIAGNOSIS — D696 Thrombocytopenia, unspecified: Secondary | ICD-10-CM

## 2014-03-12 LAB — CBC
HCT: 33.2 % — ABNORMAL LOW (ref 36.0–46.0)
HCT: 35.9 % — ABNORMAL LOW (ref 36.0–46.0)
Hemoglobin: 11.5 g/dL — ABNORMAL LOW (ref 12.0–15.0)
Hemoglobin: 12.6 g/dL (ref 12.0–15.0)
MCH: 30.1 pg (ref 26.0–34.0)
MCH: 30.1 pg (ref 26.0–34.0)
MCHC: 34.6 g/dL (ref 30.0–36.0)
MCHC: 35.1 g/dL (ref 30.0–36.0)
MCV: 85.9 fL (ref 78.0–100.0)
MCV: 86.9 fL (ref 78.0–100.0)
PLATELETS: 128 10*3/uL — AB (ref 150–400)
PLATELETS: 136 10*3/uL — AB (ref 150–400)
RBC: 3.82 MIL/uL — AB (ref 3.87–5.11)
RBC: 4.18 MIL/uL (ref 3.87–5.11)
RDW: 13.7 % (ref 11.5–15.5)
RDW: 13.7 % (ref 11.5–15.5)
WBC: 12.4 10*3/uL — ABNORMAL HIGH (ref 4.0–10.5)
WBC: 9 10*3/uL (ref 4.0–10.5)

## 2014-03-12 LAB — ABO/RH: ABO/RH(D): O POS

## 2014-03-12 LAB — TYPE AND SCREEN
ABO/RH(D): O POS
ANTIBODY SCREEN: NEGATIVE

## 2014-03-12 LAB — RPR

## 2014-03-12 MED ORDER — DIBUCAINE 1 % RE OINT: 1.0000 "application " | TOPICAL_OINTMENT | RECTAL | Status: DC | PRN

## 2014-03-12 MED ORDER — EPHEDRINE 5 MG/ML INJ
10.0000 mg | INTRAVENOUS | Status: DC | PRN
  Filled 2014-03-12: qty 2
  Filled 2014-03-12: qty 4

## 2014-03-12 MED ORDER — LIDOCAINE HCL (PF) 1 % IJ SOLN
INTRAMUSCULAR | Status: DC | PRN
Start: 2014-03-12 — End: 2014-03-12
  Administered 2014-03-12 (×4): 4 mL

## 2014-03-12 MED ORDER — PHENYLEPHRINE 40 MCG/ML (10ML) SYRINGE FOR IV PUSH (FOR BLOOD PRESSURE SUPPORT)
80.0000 ug | PREFILLED_SYRINGE | INTRAVENOUS | Status: DC | PRN
  Filled 2014-03-12: qty 2
  Filled 2014-03-12: qty 10

## 2014-03-12 MED ORDER — ZOLPIDEM TARTRATE 5 MG PO TABS: 5.0000 mg | ORAL_TABLET | Freq: Every evening | ORAL | Status: DC | PRN

## 2014-03-12 MED ORDER — PHENYLEPHRINE 40 MCG/ML (10ML) SYRINGE FOR IV PUSH (FOR BLOOD PRESSURE SUPPORT)
80.0000 ug | PREFILLED_SYRINGE | INTRAVENOUS | Status: DC | PRN
  Administered 2014-03-12 (×2): 80 ug via INTRAVENOUS
  Filled 2014-03-12: qty 2

## 2014-03-12 MED ORDER — LACTATED RINGERS IV SOLN
500.0000 mL | Freq: Once | INTRAVENOUS | Status: AC
  Administered 2014-03-12: 500 mL via INTRAVENOUS

## 2014-03-12 MED ORDER — PRENATAL MULTIVITAMIN CH
1.0000 | ORAL_TABLET | Freq: Every day | ORAL | Status: DC
  Administered 2014-03-13: 1 via ORAL
  Filled 2014-03-12: qty 1

## 2014-03-12 MED ORDER — SENNOSIDES-DOCUSATE SODIUM 8.6-50 MG PO TABS
2.0000 | ORAL_TABLET | ORAL | Status: DC
  Administered 2014-03-13: 2 via ORAL
  Filled 2014-03-12: qty 2

## 2014-03-12 MED ORDER — TERBUTALINE SULFATE 1 MG/ML IJ SOLN: 0.2500 mg | Freq: Once | INTRAMUSCULAR | Status: DC | PRN

## 2014-03-12 MED ORDER — DIPHENHYDRAMINE HCL 50 MG/ML IJ SOLN: 12.5000 mg | INTRAMUSCULAR | Status: DC | PRN

## 2014-03-12 MED ORDER — OXYTOCIN 40 UNITS IN LACTATED RINGERS INFUSION - SIMPLE MED
1.0000 m[IU]/min | INTRAVENOUS | Status: DC
  Administered 2014-03-12: 2 m[IU]/min via INTRAVENOUS

## 2014-03-12 MED ORDER — SIMETHICONE 80 MG PO CHEW: 80.0000 mg | CHEWABLE_TABLET | ORAL | Status: DC | PRN

## 2014-03-12 MED ORDER — BENZOCAINE-MENTHOL 20-0.5 % EX AERO
1.0000 "application " | INHALATION_SPRAY | CUTANEOUS | Status: DC | PRN
  Administered 2014-03-13: 1 via TOPICAL
  Filled 2014-03-12: qty 56

## 2014-03-12 MED ORDER — FENTANYL 2.5 MCG/ML BUPIVACAINE 1/10 % EPIDURAL INFUSION (WH - ANES)
14.0000 mL/h | INTRAMUSCULAR | Status: DC | PRN
  Administered 2014-03-12 (×2): 14 mL/h via EPIDURAL
  Filled 2014-03-12 (×2): qty 125

## 2014-03-12 MED ORDER — IBUPROFEN 600 MG PO TABS
600.0000 mg | ORAL_TABLET | Freq: Four times a day (QID) | ORAL | Status: DC
  Administered 2014-03-12 – 2014-03-13 (×4): 600 mg via ORAL
  Filled 2014-03-12 (×4): qty 1

## 2014-03-12 MED ORDER — LANOLIN HYDROUS EX OINT: TOPICAL_OINTMENT | CUTANEOUS | Status: DC | PRN

## 2014-03-12 MED ORDER — ONDANSETRON HCL 4 MG/2ML IJ SOLN: 4.0000 mg | INTRAMUSCULAR | Status: DC | PRN

## 2014-03-12 MED ORDER — ONDANSETRON HCL 4 MG PO TABS: 4.0000 mg | ORAL_TABLET | ORAL | Status: DC | PRN

## 2014-03-12 MED ORDER — DIPHENHYDRAMINE HCL 25 MG PO CAPS: 25.0000 mg | ORAL_CAPSULE | Freq: Four times a day (QID) | ORAL | Status: DC | PRN

## 2014-03-12 MED ORDER — TETANUS-DIPHTH-ACELL PERTUSSIS 5-2.5-18.5 LF-MCG/0.5 IM SUSP: 0.5000 mL | Freq: Once | INTRAMUSCULAR | Status: DC

## 2014-03-12 MED ORDER — WITCH HAZEL-GLYCERIN EX PADS: 1.0000 "application " | MEDICATED_PAD | CUTANEOUS | Status: DC | PRN

## 2014-03-12 MED ORDER — OXYCODONE-ACETAMINOPHEN 5-325 MG PO TABS: 1.0000 | ORAL_TABLET | ORAL | Status: DC | PRN

## 2014-03-12 NOTE — Progress Notes (Signed)
   Subjective: Pt reports feeling increased pressure.    Objective: BP 121/99  Pulse 92  Temp(Src) 98.1 F (36.7 C) (Oral)  Resp 18  Ht 5\' 3"  (1.6 m)  Wt 167 lb (75.751 kg)  BMI 29.59 kg/m2  SpO2 100%  LMP 06/21/2013      FHT:  FHR: 130's bpm, variability: moderate,  accelerations:  Present,  decelerations:  Present intermittent variables UC:   regular, every 2-5 minutes SVE:   Dilation: 9 Effacement (%): 100 Station: 0 Exam by:: Camelia EngK. Haynes, RN  Labs: Lab Results  Component Value Date   WBC 9.0 03/12/2014   HGB 12.6 03/12/2014   HCT 35.9* 03/12/2014   MCV 85.9 03/12/2014   PLT 128* 03/12/2014    Assessment / Plan: Augmentation of labor, progressing well  Labor: Progressing normally Preeclampsia:  n/a Fetal Wellbeing:  Category II Pain Control:  Epidural I/D:  GBS neg Anticipated MOD:  NSVD  [facility] Medical CenterMUHAMMAD,[name] 03/12/2014, 11:17 AM

## 2014-03-12 NOTE — Progress Notes (Signed)
Clide CliffKristen L Coviello is a 33 y.o. G3P2002 at 559w5d   Subjective: Comfortable with epidural  Objective: BP 111/68  Pulse 88  Temp(Src) 97.9 F (36.6 C) (Oral)  Resp 20  Ht 5\' 3"  (1.6 m)  Wt 75.751 kg (167 lb)  BMI 29.59 kg/m2  SpO2 100%  LMP 06/21/2013      FHT:  FHR: 130-140s bpm, variability: moderate,  accelerations:  Present,  decelerations:  Absent UC:   irregular, every 2-8 minutes SVE:   Dilation: 7.5 Effacement (%): 90 Station: 0 Exam by:: Fransisco BeauKristy Lashley RN Danella DeisPatty Phillips RN  Labs: Lab Results  Component Value Date   WBC 9.0 03/12/2014   HGB 12.6 03/12/2014   HCT 35.9* 03/12/2014   MCV 85.9 03/12/2014   PLT 128* 03/12/2014    Assessment / Plan: IUP at term Active labor/ROM  Reexamine cx in 1-2hrs or sooner with urge to push Anticipate SVD  SHAW, KIMBERLY CNM 03/12/2014, 6:04 AM

## 2014-03-12 NOTE — Progress Notes (Signed)
Notified Dr. Rodman Pickleassidy that patient is still numb in right leg and unable to ambulate yet. Patient states she can discern pressure and touch on that leg but does not feel comfortable putting weight on it to ambulate yet. Patient is able to move right leg and bend knee but visibly when up with assistive device not steady on that leg yet. Left leg with normal function and sensation. No complaints of back pain. Patient states right leg is better than last time she was up to bathroom in that she doesn't have to pick it up off the bed. Patient knows to call for assistance when she needs to go to the bathroom. No new orders but will continue to monitor patient and notify anesthesia if change in patient's status.

## 2014-03-12 NOTE — Lactation Note (Signed)
This note was copied from the chart of [name] Jeannie DoneKristen Southern. Lactation Consultation Note Initial visit at 8 hours of age.  Mom reports good feedings and baby is STS on mom's chest now.  Baby just finished a 20 minutes feeding.  Mom is experienced with breastfeeding and denies concerns.  [facility] LC resources given and discussed.  Encouraged to feed with early cues on demand.  Early newborn behavior discussed.  Hand expression demonstrated by mom with colostrum easily flowing from breast.  Mom to call for assist as needed.    Patient Name: [name] Green ZOXWR'UToday's Date: 03/12/2014 Reason for consult: Initial assessment   Maternal Data Has patient been taught Hand Expression?: Yes Does the patient have breastfeeding experience prior to this delivery?: Yes  Feeding Feeding Type: Breast Fed Length of feed: 20 min  LATCH Score/Interventions                      Lactation Tools Discussed/Used     Consult Status Consult Status: Follow-up Date: 03/13/14 Follow-up type: In-patient    Jannifer RodneyShoptaw, Jana Lynn 03/12/2014, 8:06 PM

## 2014-03-12 NOTE — H&P (Signed)
[name] Green is a 33 y.o. female 683P2002 @ 37.4wks by LMP and confirmed by 9wk scan presenting for eval of leaking fluid earlier this evening. She began with ctx shortly prior to the leaking and reports them becoming slightly stronger although the frequency remains irreg. Reports+FM. Denies bldg. [facility] and has been essentially unremarkable. She was planning on a waterbirth but only if labor occurred after this coming weekend. She still desires as much of a noninterventive birth as possible. History OB History   Grav Para Term Preterm Abortions TAB SAB Ect Mult Living   3 2 2  0 0 0 0 0 0 2     Past Medical History  Diagnosis Date  . Asthma     exercise induced, has not used inhaler in over a year  . Migraines   . Hernia   . Medical history non-contributory    Past Surgical History  Procedure Laterality Date  . Wisdom tooth extraction  2004  . Inguinal hernia repair      < 3 yo  . Hernia repair  07/15/11    umb hernia  . Cystectomy  1992  . Anterior cruciate ligament repair  02/2013   Family History: family history includes Cancer in her father; Diabetes in an other family member; Heart attack in an other family member; Heart disease in her paternal grandfather; Lung cancer in an other family member; Stroke in an other family member. Social History:  reports that she has never smoked. She has never used smokeless tobacco. She reports that she drinks alcohol. She reports that she does not use illicit drugs.   Prenatal Transfer Tool  Maternal Diabetes: No Genetic Screening: Declined Maternal Ultrasounds/Referrals: Normal Fetal Ultrasounds or other Referrals:  Other:  inadequate cardiac views on anat scan; low risk pt Maternal Substance Abuse:  No Significant Maternal Medications:  None Significant Maternal Lab Results:  Lab values include: Group B Strep negative Other Comments:  None  ROS  Dilation: 3 Effacement (%): 60 Exam  by:: KIM  SHAW, CNM Blood pressure 117/81, pulse 91, temperature 97.6 F (36.4 C), temperature source Oral, resp. rate 20, height 5\' 3"  (1.6 m), weight 75.751 kg (167 lb), last menstrual period 06/21/2013, SpO2 100.00%. Exam Physical Exam  Constitutional: She is oriented to person, place, and time. She appears well-developed.  HENT:  Head: Normocephalic.  Neck: Normal range of motion.  Cardiovascular: Normal rate.   Respiratory: Effort normal.  GI:  EFM 130-140s, +accels, no decels Ctx irreg q 3-7 mins  Genitourinary:  SSE: sm pink show, no active leaking visualized, ? Pool,  +fern Cx 3/70/-2, forebag palp  Musculoskeletal: Normal range of motion.  Neurological: She is alert and oriented to person, place, and time.  Skin: Skin is warm and dry.  Psychiatric: She has a normal mood and affect. Her behavior is normal. Thought content normal.    Prenatal labs: ABO, Rh: O/POS/-- (12/18 1556) Antibody: NEG (12/18 1556) Rubella: 4.81 (12/18 1556) RPR: NON REAC (05/04 0956)  HBsAg: NEGATIVE (12/18 1556)  HIV: NONREACTIVE (05/04 0956)  GBS:     Assessment/Plan: IUP at 37.4wks PROM GBS neg  [facility] Desires noninterventive birth as much as possible   Cam HaiSHAW, KIMBERLY CNM 03/12/2014, 12:20 AM

## 2014-03-12 NOTE — Anesthesia Preprocedure Evaluation (Addendum)
Anesthesia Evaluation  Patient identified by MRN, date of birth, ID band Patient awake    Reviewed: Allergy & Precautions, H&P , NPO status , Patient's Chart, lab work & pertinent test results, reviewed documented beta blocker date and time   History of Anesthesia Complications Negative for: history of anesthetic complications  Airway Mallampati: I TM Distance: >3 FB Neck ROM: full    Dental  (+) Teeth Intact   Pulmonary asthma (exercise induced - no inhaler use in 1 year) ,  breath sounds clear to auscultation        Cardiovascular negative cardio ROS  Rhythm:regular Rate:Normal     Neuro/Psych  Headaches, negative psych ROS   GI/Hepatic negative GI ROS, Neg liver ROS,   Endo/Other  negative endocrine ROS  Renal/GU negative Renal ROS  negative genitourinary   Musculoskeletal   Abdominal   Peds  Hematology Thrombocytopenia - plt 128   Anesthesia Other Findings   Reproductive/Obstetrics (+) Pregnancy                          Anesthesia Physical Anesthesia Plan  ASA: II  Anesthesia Plan: Epidural   Post-op Pain Management:    Induction:   Airway Management Planned:   Additional Equipment:   Intra-op Plan:   Post-operative Plan:   Informed Consent: I have reviewed the patients History and Physical, chart, labs and discussed the procedure including the risks, benefits and alternatives for the proposed anesthesia with the patient or authorized representative who has indicated his/her understanding and acceptance.     Plan Discussed with:   Anesthesia Plan Comments:         Anesthesia Quick Evaluation

## 2014-03-12 NOTE — Anesthesia Postprocedure Evaluation (Signed)
  Anesthesia Post-op Note  Patient: Darlyne RussianKristen L Forgey  Procedure(s) Performed: * No procedures listed *  Patient Location: PACU and Mother/Baby  Anesthesia Type:Epidural  Level of Consciousness: awake, alert  and oriented  Airway and Oxygen Therapy: Patient Spontanous Breathing  Post-op Pain: mild  Post-op Assessment: Post-op Vital signs reviewed  Post-op Vital Signs: Reviewed and stable  Last Vitals:  Filed Vitals:   03/12/14 1830  BP: 120/75  Pulse: 96  Temp: 36.6 C  Resp: 16    Complications: No apparent anesthesia complications

## 2014-03-12 NOTE — Anesthesia Procedure Notes (Signed)
Epidural Patient location during procedure: OB Start time: 03/12/2014 3:36 AM  Staffing Performed by: anesthesiologist   Preanesthetic Checklist Completed: patient identified, site marked, surgical consent, pre-op evaluation, timeout performed, IV checked, risks and benefits discussed and monitors and equipment checked  Epidural Patient position: sitting Prep: site prepped and draped and DuraPrep Patient monitoring: continuous pulse ox and blood pressure Approach: midline Injection technique: LOR air  Needle:  Needle type: Tuohy  Needle gauge: 17 G Needle length: 9 cm and 9 Needle insertion depth: 5 cm cm Catheter type: closed end flexible Catheter size: 19 Gauge Catheter at skin depth: 10 cm Test dose: negative  Assessment Events: blood not aspirated, injection not painful, no injection resistance, negative IV test and no paresthesia  Additional Notes Discussed risk of headache, infection, bleeding, nerve injury and failed or incomplete block.  Patient voices understanding and wishes to proceed.  Epidural placed easily on first attempt.  No paresthesia.  Patient tolerated procedure well with no apparent complications.  Jasmine DecemberA. Blue Winther, MDReason for block:procedure for pain

## 2014-03-13 ENCOUNTER — Inpatient Hospital Stay (HOSPITAL_COMMUNITY): Admission: AD | Admit: 2014-03-13 | Payer: 59 | Source: Ambulatory Visit | Admitting: Obstetrics & Gynecology

## 2014-03-13 MED ORDER — IBUPROFEN 600 MG PO TABS: 600.0000 mg | ORAL_TABLET | Freq: Four times a day (QID) | ORAL | Status: DC

## 2014-03-13 NOTE — Discharge Instructions (Signed)

## 2014-03-13 NOTE — Progress Notes (Signed)
Post Partum Day 1  Subjective: up ad lib, voiding, tolerating PO, + flatus and has continued right leg numbness from epidural that is making ambulation difficult  Objective: Blood pressure 99/67, pulse 73, temperature 98 F (36.7 C), temperature source Oral, resp. rate 18, height 5\' 3"  (1.6 m), weight 75.751 kg (167 lb), last menstrual period 06/21/2013, SpO2 97.00%, currently breastfeeding.  Physical Exam:  General: alert, cooperative and no distress Lochia: appropriate Uterine Fundus: firm Incision: N/A DVT Evaluation: No evidence of DVT seen on physical exam. Negative Homan's sign. No cords or calf tenderness. No significant calf/ankle edema.   Recent Labs  03/12/14 0015 03/12/14 1220  HGB 12.6 11.5*  HCT 35.9* 33.2*    Assessment/Plan: Plan for discharge tomorrow, Breastfeeding and Contraception Mirena. Not interested in circumcision Possible discharge this evening if baby is cleared and pt ambulating.  LOS: 2 days   Letta PateSanders, Alyssa PA-S 03/13/2014, 7:20 AM   I spoke with and examined patient and agree with PA-S's note and plan of care.  Tawana ScaleMichael Ryan Wilmore Holsomback, MD Ob Fellow 03/13/2014 9:22 AM

## 2014-03-13 NOTE — H&P (Signed)
Attestation of Attending Supervision of Advanced Practitioner (CNM/NP): Evaluation and management procedures were performed by the Advanced Practitioner under my supervision and collaboration. I have reviewed the Advanced Practitioner's note and chart, and I agree with the management and plan.  Juleah Paradise H. 9:07 AM   

## 2014-03-20 NOTE — Discharge Summary (Signed)
Obstetric Discharge Summary Reason for Admission: onset of labor Prenatal Procedures: NST Intrapartum Procedures: spontaneous vaginal delivery Postpartum Procedures: none Complications-Operative and Postpartum: none Hemoglobin  Date Value Ref Range Status  03/12/2014 11.5* 12.0 - 15.0 [name]/dL Final     HCT  Date Value Ref Range Status  03/12/2014 33.2* 36.0 - 46.0 % Final   Hospital Course:  [name] [name] is a 33 y.o. female [id number] @ 37.4wks by LMP and confirmed by 9wk scan presenting for eval of leaking fluid earlier this evening. She began with ctx shortly prior to the leaking and reports them becoming slightly stronger although the frequency remains irreg. Reports+FM. Denies bldg. [facility] and has been essentially unremarkable. She was planning on a waterbirth but only if labor occurred after this coming weekend. She still desires as much of a noninterventive birth as possible.  Pt. Was admitted to L&D where she progressed without complication to NSVD. She had no intrapartum complications. She is now stable and ready for discharge.   Delivery Note  At 11:39 AM a viable and healthy female was delivered via Vaginal, Spontaneous Delivery (Presentation: Right Occiput Anterior). APGAR: 8, 9; weight pending .  Placenta status: Intact, Spontaneous. Cord: 3 vessels with the following complications: None. Cord pH: n/a  Anesthesia: Epidural  Episiotomy: None  Lacerations:2nd degree  Suture Repair: 3.0 vicryl rapide  Est. Blood Loss (mL): 400  Mom to postpartum. Baby to Couplet care / Skin to Skin.  [facility],[name]  03/12/2014, 12:20 PM     Physical Exam:  General: alert, cooperative and no distress Lochia: appropriate Uterine Fundus: firm Incision: N/A DVT Evaluation: No evidence of DVT seen on physical exam. No cords or calf tenderness.  Discharge Diagnoses: Term Pregnancy-delivered  Discharge Information: Date: 03/20/2014 Activity:  unrestricted and pelvic rest Diet: routine Medications: PNV and Ibuprofen Condition: stable Instructions: refer to practice specific booklet Discharge to: home Follow-up Information   Follow up with [facility]. Schedule an appointment as soon as possible for a visit in 4 weeks. (Follow up / LARC)    Contact information:   [address] [place] [id number]       Newborn Data: Live born female  Birth Weight: 7 lb 4.6 oz (3306 [name]) APGAR: 8, 9  Home with mother.  [name] [name] 03/20/2014, 12:53 AM  I have seen and examined this patient and agree with above documentation in the resident's note.   Rulon AbideKeli Suvi [name], M.D. [facility] 03/21/2014 8:00 AM

## 2014-03-24 NOTE — Discharge Summary (Signed)
Attestation of Attending Supervision of Obstetric Fellow: Evaluation and management procedures were performed by the Obstetric Fellow under my supervision and collaboration.  I have reviewed the Obstetric Fellow's note and chart, and I agree with the management and plan.  Iaan Oregel, MD, FACOG Attending Obstetrician & Gynecologist Faculty Practice, [facility]   

## 2014-04-18 ENCOUNTER — Encounter: Payer: Self-pay | Admitting: Obstetrics and Gynecology

## 2014-04-18 ENCOUNTER — Ambulatory Visit (INDEPENDENT_AMBULATORY_CARE_PROVIDER_SITE_OTHER): Payer: 59 | Admitting: Obstetrics and Gynecology

## 2014-04-18 DIAGNOSIS — Z30430 Encounter for insertion of intrauterine contraceptive device: Secondary | ICD-10-CM

## 2014-04-18 MED ORDER — LEVONORGESTREL 20 MCG/24HR IU IUD
1.0000 | INTRAUTERINE_SYSTEM | Freq: Once | INTRAUTERINE | Status: AC
  Administered 2014-04-18: 1 via INTRAUTERINE

## 2014-04-18 NOTE — Addendum Note (Signed)
Addended by: Arne ClevelandHUTCHINSON, MANDY J on: 04/18/2014 11:43 AM   Modules accepted: Orders

## 2014-04-18 NOTE — Progress Notes (Signed)
  Subjective:     [name] Green is a 33 y.o. female who presents for a postpartum visit. She is 5 weeks postpartum following a spontaneous vaginal delivery. I have fully reviewed the prenatal and intrapartum course. The delivery was at 37.4 gestational weeks. Outcome: spontaneous vaginal delivery. Anesthesia: epidural. Postpartum course has been uncomplicated. Baby's course has been uncomplicated. Baby is feeding by breast. Bleeding no bleeding. Bowel function is normal. Bladder function is normal. Patient is not sexually active. Contraception method is abstinence. Postpartum depression screening: negative. She does report having nocturnal anxiety most nights for brief periods, worrying about family members dying. No sx during waking hours.   The following portions of the patient's history were reviewed and updated as appropriate: allergies, current medications, past family history, past medical history, past social history, past surgical history and problem list.  Review of Systems Pertinent items are noted in HPI.   Objective:    BP 114/68  Pulse 82  Resp 16  Ht 5\' 3"  (1.6 m)  Wt 140 lb (63.504 kg)  BMI 24.81 kg/m2  Breastfeeding? Yes  General:  alert, cooperative and no distress   Breasts:  inspection negative, no nipple discharge or bleeding, no masses or nodularity palpable  Lungs: clear to auscultation bilaterally  Heart:  regular rate and rhythm, S1, S2 normal, no murmur, click, rub or gallop  Abdomen: soft, non-tender; bowel sounds normal; no masses,  no organomegaly   Vulva:  normal  Vagina: normal vagina  Cervix:  multiparous appearance Friable anterior  Corpus: normal size, contour, position, consistency, mobility, non-tender retroverted  Adnexa:  no mass, fullness, tenderness  Rectal Exam: Not performed.          Procedure Note: Mirena insertion Risks of procedure including bleeding, infection, expulsion, pregnancy with IUD reviewed. Bimanual to determine ant cx,  retroverted uterus, fully involuted. Pt in lithotomy and speculum inserted to bring cx into view. Cx friable. Single tooth tenacullum naculum placed a t 4 and 8 o'clock. Sounded to 8 cm. Mirena inserted in usual fashion. Strings cut to 3 cm. Silver nitrate applied for hemostasis at 8 o'clock where tenaculum removed and pressure did not control oozing. Pt tolerated procedure well. EBL 10cc   Assessment:     5 wks postpartum exam. Pap smear not done at today's visit.  PP anxiety Mirena insertion  Plan:    1. Contraception: IUD 2. Discussed resources  and anti- anxiety measures; expect self-limited course. F/U next vist or call if worse.  3. Follow up in: 6 weeks or as needed.

## 2014-04-18 NOTE — Patient Instructions (Signed)
Levonorgestrel intrauterine device (IUD) What is this medicine? LEVONORGESTREL IUD (LEE voe nor jes trel) is a contraceptive (birth control) device. The device is placed inside the uterus by a healthcare professional. It is used to prevent pregnancy and can also be used to treat heavy bleeding that occurs during your period. Depending on the device, it can be used for 3 to 5 years. This medicine may be used for other purposes; ask your health care provider or pharmacist if you have questions. COMMON BRAND NAME(S): LILETTA, Mirena, Skyla What should I tell my health care provider before I take this medicine? They need to know if you have any of these conditions: -abnormal Pap smear -cancer of the breast, uterus, or cervix -diabetes -endometritis -genital or pelvic infection now or in the past -have more than one sexual partner or your partner has more than one partner -heart disease -history of an ectopic or tubal pregnancy -immune system problems -IUD in place -liver disease or tumor -problems with blood clots or take blood-thinners -use intravenous drugs -uterus of unusual shape -vaginal bleeding that has not been explained -an unusual or allergic reaction to levonorgestrel, other hormones, silicone, or polyethylene, medicines, foods, dyes, or preservatives -pregnant or trying to get pregnant -breast-feeding How should I use this medicine? This device is placed inside the uterus by a health care professional. Talk to your pediatrician regarding the use of this medicine in children. Special care may be needed. Overdosage: If you think you have taken too much of this medicine contact a poison control center or emergency room at once. NOTE: This medicine is only for you. Do not share this medicine with others. What if I miss a dose? This does not apply. What may interact with this medicine? Do not take this medicine with any of the following  medications: -amprenavir -bosentan -fosamprenavir This medicine may also interact with the following medications: -aprepitant -barbiturate medicines for inducing sleep or treating seizures -bexarotene -griseofulvin -medicines to treat seizures like carbamazepine, ethotoin, felbamate, oxcarbazepine, phenytoin, topiramate -modafinil -pioglitazone -rifabutin -rifampin -rifapentine -some medicines to treat HIV infection like atazanavir, indinavir, lopinavir, nelfinavir, tipranavir, ritonavir -St. John's wort -warfarin This list may not describe all possible interactions. Give your health care provider a list of all the medicines, herbs, non-prescription drugs, or dietary supplements you use. Also tell them if you smoke, drink alcohol, or use illegal drugs. Some items may interact with your medicine. What should I watch for while using this medicine? Visit your doctor or health care professional for regular check ups. See your doctor if you or your partner has sexual contact with others, becomes HIV positive, or gets a sexual transmitted disease. This product does not protect you against HIV infection (AIDS) or other sexually transmitted diseases. You can check the placement of the IUD yourself by reaching up to the top of your vagina with clean fingers to feel the threads. Do not pull on the threads. It is a good habit to check placement after each menstrual period. Call your doctor right away if you feel more of the IUD than just the threads or if you cannot feel the threads at all. The IUD may come out by itself. You may become pregnant if the device comes out. If you notice that the IUD has come out use a backup birth control method like condoms and call your health care provider. Using tampons will not change the position of the IUD and are okay to use during your period. What side effects may   I notice from receiving this medicine? Side effects that you should report to your doctor or  health care professional as soon as possible: -allergic reactions like skin rash, itching or hives, swelling of the face, lips, or tongue -fever, flu-like symptoms -genital sores -high blood pressure -no menstrual period for 6 weeks during use -pain, swelling, warmth in the leg -pelvic pain or tenderness -severe or sudden headache -signs of pregnancy -stomach cramping -sudden shortness of breath -trouble with balance, talking, or walking -unusual vaginal bleeding, discharge -yellowing of the eyes or skin Side effects that usually do not require medical attention (report to your doctor or health care professional if they continue or are bothersome): -acne -breast pain -change in sex drive or performance -changes in weight -cramping, dizziness, or faintness while the device is being inserted -headache -irregular menstrual bleeding within first 3 to 6 months of use -nausea This list may not describe all possible side effects. Call your doctor for medical advice about side effects. You may report side effects to FDA at 1-800-FDA-1088. Where should I keep my medicine? This does not apply. NOTE: This sheet is a summary. It may not cover all possible information. If you have questions about this medicine, talk to your doctor, pharmacist, or health care provider.  2015, Elsevier/Gold Standard. (2011-09-22 13:54:04)  

## 2014-07-07 ENCOUNTER — Encounter: Payer: Self-pay | Admitting: Obstetrics and Gynecology

## 2014-07-28 ENCOUNTER — Ambulatory Visit: Payer: 59 | Admitting: Family Medicine

## 2014-07-28 ENCOUNTER — Ambulatory Visit (INDEPENDENT_AMBULATORY_CARE_PROVIDER_SITE_OTHER): Payer: 59 | Admitting: *Deleted

## 2014-07-28 VITALS — Temp 98.6°F

## 2014-07-28 DIAGNOSIS — Z23 Encounter for immunization: Secondary | ICD-10-CM

## 2014-07-28 NOTE — Progress Notes (Signed)
Pt here for flu immunization. She tolerated injection well.[name] Green °

## 2014-09-29 ENCOUNTER — Ambulatory Visit (INDEPENDENT_AMBULATORY_CARE_PROVIDER_SITE_OTHER): Payer: 59 | Admitting: Family Medicine

## 2014-09-29 ENCOUNTER — Encounter: Payer: Self-pay | Admitting: Family Medicine

## 2014-09-29 VITALS — BP 107/56 | HR 60 | Wt 122.0 lb

## 2014-09-29 DIAGNOSIS — T8189XA Other complications of procedures, not elsewhere classified, initial encounter: Secondary | ICD-10-CM

## 2014-09-29 DIAGNOSIS — R140 Abdominal distension (gaseous): Secondary | ICD-10-CM

## 2014-09-29 NOTE — Progress Notes (Signed)
   Subjective:    Patient ID: [name] Green, female    DOB: 07/09/1981, 34 y.o.   MRN: [id number]  HPI Patient complains of feeling a knot over the incisional site of her umbilical hernia. She had it repaired about 3 years ago. She denies any pain or tenderness or redness over the area  She also complains of bloating that started a couple days ago. It seems to be happening in the evenings. She denies any recent changes in her diet. In fact she recently cut dairy back out of her diet because her son was not sleeping well at night and she is nursing him. No nausea, vomiting, or constipation. Review of Systems     Objective:   Physical Exam  Constitutional: She appears well-developed.  HENT:  Head: Normocephalic and atraumatic.  Abdominal: Soft. Bowel sounds are normal. She exhibits no distension and no mass. There is no tenderness. There is no rebound and no guarding.  Palpable knot underneath the surface of her excision just below the umbilicus. The scar is otherwise well-healed. No erythema or tenderness.   Skin: Skin is warm and dry.  Psychiatric: She has a normal mood and affect. Her behavior is normal.          Assessment & Plan:  Knot over incisional site-most consistent with internal suture. Asked Bonita QuinLinda her that most of the time these are absorbable but sometimes can cause a foreign body reaction and will actually be pushed to the surface of the skin. If it becomes tender or red at any point time then please let us know more refer back to the surgeon. Also consider could be scar tissue services less of a concern. As the skin over the lesion is mobile.  Bloating. Recommend a trial of a probiotic. Certainly if it persists then please let me know. She feels like her bowels are moving normally. Also reviewed diet to see if there have been any recent changes. Consider checking for gluten intolerance if it persists. Unlikely to be lactose intolerant as she has actually cut out dairy  the last several weeks.

## 2014-09-29 NOTE — Patient Instructions (Signed)
Recommend a trial of a probiotic for the bloating.  If still occuring in 3-4 weeks then let me know and will do additional work-up.   Bloating Bloating is the feeling of fullness in your belly. You may feel as though your pants are too tight. Often the cause of bloating is overeating, retaining fluids, or having gas in your bowel. It is also caused by swallowing air and eating foods that cause gas. Irritable bowel syndrome is one of the most common causes of bloating. Constipation is also a common cause. Sometimes more serious problems can cause bloating. SYMPTOMS  Usually there is a feeling of fullness, as though your abdomen is bulged out. There may be mild discomfort.  DIAGNOSIS  Usually no particular testing is necessary for most bloating. If the condition persists and seems to become worse, your caregiver may do additional testing.  TREATMENT   There is no direct treatment for bloating.  Do not put gas into the bowel. Avoid chewing gum and sucking on candy. These tend to make you swallow air. Swallowing air can also be a nervous habit. Try to avoid this.  Avoiding high residue diets will help. Eat foods with soluble fibers (examples include root vegetables, apples, or barley) and substitute dairy products with soy and rice products. This helps irritable bowel syndrome.  If constipation is the cause, then a high residue diet with more fiber will help.  Avoid carbonated beverages.  Over-the-counter preparations are available that help reduce gas. Your pharmacist can help you with this. SEEK MEDICAL CARE IF:   Bloating continues and seems to be getting worse.  You notice a weight gain.  You have a weight loss but the bloating is getting worse.  You have changes in your bowel habits or develop nausea or vomiting. SEEK IMMEDIATE MEDICAL CARE IF:   You develop shortness of breath or swelling in your legs.  You have an increase in abdominal pain or develop chest pain. Document  Released: 06/22/2006 Document Revised: 11/14/2011 Document Reviewed: 08/10/2007 [facility] Patient Information 2015 [employer] [place]. This information is not intended to replace advice given to you by your health care provider. Make sure you discuss any questions you have with your health care provider.

## 2014-09-30 ENCOUNTER — Ambulatory Visit: Payer: Self-pay | Admitting: Family Medicine

## 2015-06-11 ENCOUNTER — Ambulatory Visit (INDEPENDENT_AMBULATORY_CARE_PROVIDER_SITE_OTHER): Payer: 59 | Admitting: Family Medicine

## 2015-06-11 VITALS — Temp 97.5°F

## 2015-06-11 DIAGNOSIS — Z23 Encounter for immunization: Secondary | ICD-10-CM

## 2016-10-20 ENCOUNTER — Ambulatory Visit (INDEPENDENT_AMBULATORY_CARE_PROVIDER_SITE_OTHER): Payer: 59 | Admitting: Family Medicine

## 2016-10-20 VITALS — Temp 98.5°F

## 2016-10-20 DIAGNOSIS — Z23 Encounter for immunization: Secondary | ICD-10-CM

## 2016-10-20 NOTE — Addendum Note (Signed)
Addended by: Juel BurrowBENDER, Jamariah Tony L on: 10/20/2016 03:35 PM   Modules accepted: Level of Service

## 2016-10-20 NOTE — Progress Notes (Signed)
Flu shot given LD without complications.

## 2016-11-09 ENCOUNTER — Encounter: Payer: Self-pay | Admitting: Family Medicine

## 2016-11-09 ENCOUNTER — Ambulatory Visit (INDEPENDENT_AMBULATORY_CARE_PROVIDER_SITE_OTHER): Payer: 59 | Admitting: Family Medicine

## 2016-11-09 VITALS — BP 117/74 | HR 67 | Ht 63.0 in | Wt 126.0 lb

## 2016-11-09 DIAGNOSIS — G43001 Migraine without aura, not intractable, with status migrainosus: Secondary | ICD-10-CM

## 2016-11-09 MED ORDER — PREDNISONE 10 MG PO TABS: ORAL_TABLET | ORAL | 0 refills | Status: DC

## 2016-11-09 MED ORDER — SUMATRIPTAN SUCCINATE 100 MG PO TABS: 100.0000 mg | ORAL_TABLET | ORAL | 0 refills | Status: DC | PRN

## 2016-11-09 NOTE — Progress Notes (Signed)
CC: HA x 8 days.   Subjective:    Patient ID: [name] Green, female    DOB: 1981-01-04, 36 y.o.   MRN: [id number]  HPI 36 yo female comes in with HA for about 8 days.  Says it is uncomfortable.  Comes and goes. Starts in her right temples and then radiates to the left side. No HA right now.  Alternating Tylenol and IBU.  No vision changes or nausea with it. Last LMP 2 weeks ago.  Did travel some last week.  Hx of migraines during pregnancy.  She has an IUD in.  He feels like she's sleeping well. She denies any sinus symptoms such as congestion sneezing runny nose watery eyes. No ear pain or popping. One night the headache was severe enough that it was actually waking her up.   Review of Systems  BP 117/74   Pulse 67   Ht 5\' 3"  (1.6 m)   Wt 126 lb (57.2 kg)   LMP 10/26/2016 (Approximate)   SpO2 100%   BMI 22.32 kg/m     Allergies  Allergen Reactions  . Amoxicillin Hives    Past Medical History:  Diagnosis Date  . Asthma    exercise induced, has not used inhaler in over a year  . Hernia   . Medical history non-contributory   . Migraines     Past Surgical History:  Procedure Laterality Date  . ANTERIOR CRUCIATE LIGAMENT REPAIR  02/2013  . CYSTECTOMY  1992  . HERNIA REPAIR  07/15/11   umb hernia  . INGUINAL HERNIA REPAIR     < 3 yo  . WISDOM TOOTH EXTRACTION  2004    Social History   Social History  . Marital status: Married    Spouse name: Pennie Rushing  . Number of children: 2  . Years of education: N/A   Occupational History  . teacher     [employer]   Social History Main Topics  . Smoking status: Never Smoker  . Smokeless tobacco: Never Used  . Alcohol use Yes     Comment: occasionally  . Drug use: No  . Sexual activity: Yes    Partners: Male    Birth control/ protection: IUD, None   Other Topics Concern  . Not on file   Social History Narrative   Teacher at [employer].  Married to [employer].  No children.     Family History  Problem Relation Age of Onset   . Diabetes      grandparent  . Stroke      grandparent  . Heart attack      grandparent  . Lung cancer      grandparent  . Cancer Father     prostate  . Heart disease Paternal Grandfather     Outpatient Encounter Prescriptions as of 11/09/2016  Medication Sig  . acetaminophen (TYLENOL) 325 MG tablet Take 650 mg by mouth every 6 (six) hours as needed.  Marland Kitchen albuterol (PROVENTIL HFA;VENTOLIN HFA) 108 (90 BASE) MCG/ACT inhaler Inhale 2 puffs into the lungs every 6 (six) hours as needed. To prevent asthma attack  . ibuprofen (ADVIL,MOTRIN) 600 MG tablet Take 1 tablet (600 mg total) by mouth every 6 (six) hours.  . Pediatric Multivit-Minerals-C (MULTIVITAMIN GUMMIES CHILDRENS) CHEW Chew by mouth.  . predniSONE (DELTASONE) 10 MG tablet 8 tabs po Day 1, 6 tabs po Day 2, 4 tabs Day 3, 2 tabs Day 4, 1 tab Day 5  . SUMAtriptan (IMITREX) 100 MG tablet Take 1  tablet (100 mg total) by mouth every 2 (two) hours as needed for migraine. May repeat in 2 hours if headache persists or recurs.  . [DISCONTINUED] Prenatal Vit-Fe Fumarate-FA (MULTIVITAMIN-PRENATAL) 27-0.8 MG TABS tablet Take 1 tablet by mouth daily at 12 noon.   No facility-administered encounter medications on file as of 11/09/2016.          Objective:   Physical Exam  Constitutional: She is oriented to person, place, and time. She appears well-developed and well-nourished.  HENT:  Head: Normocephalic and atraumatic.  Right Ear: External ear normal.  Left Ear: External ear normal.  Nose: Nose normal.  Mouth/Throat: Oropharynx is clear and moist.  TMs and canals are clear.   Eyes: Conjunctivae and EOM are normal. Pupils are equal, round, and reactive to light.  Neck: Neck supple. No thyromegaly present.  Cardiovascular: Normal rate, regular rhythm and normal heart sounds.   Pulmonary/Chest: Effort normal and breath sounds normal. She has no wheezes.  Lymphadenopathy:    She has no cervical adenopathy.  Neurological: She is alert and  oriented to person, place, and time.  Skin: Skin is warm and dry.  Psychiatric: She has a normal mood and affect.        Assessment & Plan:  Migraine HA - This diagnosis and treatment. Recommend a prednisone taper to try to break her headache cycle. Reviewed things like allergies, weather change, change in diet, lack of sleep etc. they can actually trigger headaches. We'll also get a prescription for Imitrex to use as needed for rescue if her Tylenol and IBU prison is not working. I did remind her not to use her ibuprofen with the prednisone we also discussed side effects of the prednisone. Make sure to take with food and water and stop immediately if any GI upset or irritation or chest pain with it.

## 2017-02-08 DIAGNOSIS — Z0100 Encounter for examination of eyes and vision without abnormal findings: Secondary | ICD-10-CM | POA: Diagnosis not present

## 2017-07-11 ENCOUNTER — Ambulatory Visit (INDEPENDENT_AMBULATORY_CARE_PROVIDER_SITE_OTHER): Payer: 59 | Admitting: Family Medicine

## 2017-07-11 DIAGNOSIS — Z23 Encounter for immunization: Secondary | ICD-10-CM | POA: Diagnosis not present

## 2017-09-20 ENCOUNTER — Encounter: Payer: Self-pay | Admitting: Family Medicine

## 2017-09-20 ENCOUNTER — Ambulatory Visit (INDEPENDENT_AMBULATORY_CARE_PROVIDER_SITE_OTHER): Payer: 59 | Admitting: Family Medicine

## 2017-09-20 VITALS — BP 105/66 | HR 88 | Ht 63.0 in | Wt 127.0 lb

## 2017-09-20 DIAGNOSIS — B9689 Other specified bacterial agents as the cause of diseases classified elsewhere: Secondary | ICD-10-CM | POA: Diagnosis not present

## 2017-09-20 DIAGNOSIS — J208 Acute bronchitis due to other specified organisms: Secondary | ICD-10-CM | POA: Diagnosis not present

## 2017-09-20 DIAGNOSIS — J45909 Unspecified asthma, uncomplicated: Secondary | ICD-10-CM

## 2017-09-20 MED ORDER — AZITHROMYCIN 250 MG PO TABS
ORAL_TABLET | ORAL | 0 refills | Status: AC
Start: 2017-09-20 — End: 2017-09-25

## 2017-09-20 MED ORDER — PREDNISONE 20 MG PO TABS: 40.0000 mg | ORAL_TABLET | Freq: Every day | ORAL | 0 refills | Status: DC

## 2017-09-20 MED ORDER — ALBUTEROL SULFATE HFA 108 (90 BASE) MCG/ACT IN AERS: 2.0000 | INHALATION_SPRAY | Freq: Four times a day (QID) | RESPIRATORY_TRACT | 11 refills | Status: DC | PRN

## 2017-09-20 NOTE — Patient Instructions (Addendum)

## 2017-09-20 NOTE — Progress Notes (Signed)
   Subjective:    Patient ID: [name] Green, female    DOB: 08/25/1981, 37 y.o.   MRN: [id number]  HPI 37 year old female comes in today complaining of upper respiratory symptoms including a cough for about 3.5 weeks.  She is been mostly using cough drops and over the weekend did try some Tylenol Cold and flu.  She denies any fevers chills or sweats.  Cough is been productive at times with yellow and green mucus.  It does seem to be worse at night. Says it keep her awake last night.  She also had more of a head cold with headache and nasal congestion over this past weekend but it only lasted about 3 days and then that part started to feel better.  She does feel occasionally short of breath.  She is been using her mom's albuterol.  She does have a history of asthma.   Review of Systems     Objective:   Physical Exam  Constitutional: She is oriented to person, place, and time. She appears well-developed and well-nourished.  HENT:  Head: Normocephalic and atraumatic.  Right Ear: External ear normal.  Left Ear: External ear normal.  Nose: Nose normal.  Mouth/Throat: Oropharynx is clear and moist.  TMs and canals are clear.   Eyes: Conjunctivae and EOM are normal. Pupils are equal, round, and reactive to light.  Neck: Neck supple. No thyromegaly present.  Cardiovascular: Normal rate, regular rhythm and normal heart sounds.  Pulmonary/Chest: Effort normal and breath sounds normal. She has no wheezes.  Lymphadenopathy:    She has no cervical adenopathy.  Neurological: She is alert and oriented to person, place, and time.  Skin: Skin is warm and dry.  Psychiatric: She has a normal mood and affect.       Assessment & Plan:  Acute bacterial bronchitis - will tx with zpack and prednisone.  Call if not some better by Monday.  Ok for  symptomatic care.

## 2018-02-19 ENCOUNTER — Encounter: Payer: Self-pay | Admitting: Family Medicine

## 2018-02-19 ENCOUNTER — Ambulatory Visit (INDEPENDENT_AMBULATORY_CARE_PROVIDER_SITE_OTHER): Payer: 59 | Admitting: Family Medicine

## 2018-02-19 VITALS — BP 119/75 | HR 69

## 2018-02-19 DIAGNOSIS — B852 Pediculosis, unspecified: Secondary | ICD-10-CM

## 2018-02-19 MED ORDER — IVERMECTIN 0.5 % EX LOTN: 1.0000 "application " | TOPICAL_LOTION | Freq: Once | CUTANEOUS | 1 refills | Status: AC

## 2018-02-19 NOTE — Progress Notes (Signed)
   Subjective:    Patient ID: [name] Green, female    DOB: 04/18/1981, 37 y.o.   MRN: [id number]  HPI 37 year old female comes in today concerned about possibility of lice.  Unfortunately they have been passing it back and forth within the family.  They have tried the over-the-counter products they just do not seem to work.  She specifically requesting Sklice which they have used before and was effective.  Unfortunately 1 of her daughter's best friends is who keeps giving it back and forth.  She was able to see the bugs in her hair this morning.  And she is experiencing the itching and irritation.   Review of Systems     Objective:   Physical Exam  Constitutional: She is oriented to person, place, and time. She appears well-developed and well-nourished.  HENT:  Head: Normocephalic and atraumatic.  Eyes: Conjunctivae and EOM are normal.  Cardiovascular: Normal rate.  Pulmonary/Chest: Effort normal.  Neurological: She is alert and oriented to person, place, and time.  Skin: Skin is dry. No pallor.  Psychiatric: She has a normal mood and affect. Her behavior is normal.  Vitals reviewed.      Assessment & Plan:  Lice -prescription sent for slice for her and her entire family. Call if not resolving.

## 2018-04-20 ENCOUNTER — Ambulatory Visit (INDEPENDENT_AMBULATORY_CARE_PROVIDER_SITE_OTHER): Payer: 59 | Admitting: Family Medicine

## 2018-04-20 ENCOUNTER — Encounter: Payer: Self-pay | Admitting: Family Medicine

## 2018-04-20 DIAGNOSIS — Z23 Encounter for immunization: Secondary | ICD-10-CM

## 2018-04-20 NOTE — Addendum Note (Signed)
Addended by: Deno EtienneBARKLEY, Aradhana Gin L on: 04/20/2018 04:13 PM   Modules accepted: Orders

## 2018-04-20 NOTE — Progress Notes (Signed)
Pt here for flu shot. Afebrile,no recent illness. Vaccination given, pt tolerated well..[name] Green Lynetta, CMA  

## 2018-08-07 ENCOUNTER — Telehealth: Payer: Self-pay | Admitting: *Deleted

## 2018-08-07 NOTE — Telephone Encounter (Signed)
Returned call to patient from 08/06/18 at 1:30pm and left a message for patient to call and schedule IUD removal appointment. Patient may also need an annual. She has not been here since 04/18/14 Postpartum visit.

## 2018-08-14 ENCOUNTER — Ambulatory Visit (INDEPENDENT_AMBULATORY_CARE_PROVIDER_SITE_OTHER): Payer: 59

## 2018-08-14 ENCOUNTER — Encounter: Payer: Self-pay | Admitting: Family Medicine

## 2018-08-14 ENCOUNTER — Ambulatory Visit: Payer: 59 | Admitting: Family Medicine

## 2018-08-14 VITALS — BP 125/65 | HR 69 | Wt 127.0 lb

## 2018-08-14 DIAGNOSIS — X58XXXA Exposure to other specified factors, initial encounter: Secondary | ICD-10-CM | POA: Diagnosis not present

## 2018-08-14 DIAGNOSIS — S99921A Unspecified injury of right foot, initial encounter: Secondary | ICD-10-CM

## 2018-08-14 DIAGNOSIS — Y9366 Activity, soccer: Secondary | ICD-10-CM | POA: Diagnosis not present

## 2018-08-14 DIAGNOSIS — S92414A Nondisplaced fracture of proximal phalanx of right great toe, initial encounter for closed fracture: Secondary | ICD-10-CM

## 2018-08-14 DIAGNOSIS — S92411A Displaced fracture of proximal phalanx of right great toe, initial encounter for closed fracture: Secondary | ICD-10-CM | POA: Diagnosis not present

## 2018-08-14 DIAGNOSIS — S92511A Displaced fracture of proximal phalanx of right lesser toe(s), initial encounter for closed fracture: Secondary | ICD-10-CM | POA: Diagnosis not present

## 2018-08-14 HISTORY — DX: Nondisplaced fracture of proximal phalanx of right great toe, initial encounter for closed fracture: S92.414A

## 2018-08-14 NOTE — Patient Instructions (Signed)
Thank you for coming in today. Use the boot with walking.  Do not drive with the boot.  Ok to take ibuprofen/aleve or tylenol for pain.  Return in about 2 weeks for recheck.  Walking in the boot is ok as guided by pain.   Consider Vit d and Calcium (2000 units of vit d daily and 1000mg  calcium twice daily).   Toe Fracture A toe fracture is a break in one of the toe bones (phalanges). What are the causes? This condition may be caused by:  Dropping a heavy object on your toe.  Stubbing your toe.  Overusing your toe or doing repetitive exercise.  Twisting or stretching your toe out of place.  What increases the risk? This condition is more likely to develop in people who:  Play contact sports.  Have a bone disease.  Have a low calcium level.  What are the signs or symptoms? The main symptoms of this condition are swelling and pain in the toe. The pain may get worse with standing or walking. Other symptoms include:  Bruising.  Stiffness.  Numbness.  A change in the way the toe looks.  Broken bones that poke through the skin.  Blood beneath the toenail.  How is this diagnosed? This condition is diagnosed with a physical exam. You may also have X-rays. How is this treated? Treatment for this condition depends on the type of fracture and its severity. Treatment may involve:  Taping the broken toe to a toe that is next to it (buddy taping). This is the most common treatment for fractures in which the bone has not moved out of place (nondisplaced fracture).  Wearing a shoe that has a wide, rigid sole to protect the toe and to limit its movement.  Wearing a walking cast.  Having a procedure to move the toe back into place.  Surgery. This may be needed: ? If there are many pieces of broken bone that are out of place (displaced). ? If the toe joint breaks. ? If the bone breaks through the skin.  Physical therapy. This is done to help regain movement and strength  in the toe.  You may need follow-up X-rays to make sure that the bone is healing well and staying in position. Follow these instructions at home: If you have a cast:  Do not stick anything inside the cast to scratch your skin. Doing that increases your risk of infection.  Check the skin around the cast every day. Report any concerns to your health care provider. You may put lotion on dry skin around the edges of the cast. Do not apply lotion to the skin underneath the cast.  Do not put pressure on any part of the cast until it is fully hardened. This may take several hours.  Keep the cast clean and dry. Bathing  Do not take baths, swim, or use a hot tub until your health care provider approves. Ask your health care provider if you can take showers. You may only be allowed to take sponge baths for bathing.  If your health care provider approves bathing and showering, cover the cast or bandage (dressing) with a watertight plastic bag to protect it from water. Do not let the cast or dressing get wet. Managing pain, stiffness, and swelling  If you do not have a cast, apply ice to the injured area, if directed. ? Put ice in a plastic bag. ? Place a towel between your skin and the bag. ? Leave the  ice on for 20 minutes, 2-3 times per day.  Move your toes often to avoid stiffness and to lessen swelling.  Raise (elevate) the injured area above the level of your heart while you are sitting or lying down. Driving  Do not drive or operate heavy machinery while taking pain medicine.  Do not drive while wearing a cast on a foot that you use for driving. Activity  Return to your normal activities as directed by your health care provider. Ask your health care provider what activities are safe for you.  Perform exercises daily as directed by your health care provider or physical therapist. Safety  Do not use the injured limb to support your body weight until your health care provider says  that you can. Use crutches or other assistive devices as directed by your health care provider. General instructions  If your toe was treated with buddy taping, follow your health care provider's instructions for changing the gauze and tape. Change it more often: ? The gauze and tape get wet. If this happens, dry the space between the toes. ? The gauze and tape are too tight and cause your toe to become pale or numb.  Wear a protective shoe as directed by your health care provider. If you were not given a protective shoe, wear sturdy, supportive shoes. Your shoes should not pinch your toes and should not fit tightly against your toes.  Do not use any tobacco products, including cigarettes, chewing tobacco, or e-cigarettes. Tobacco can delay bone healing. If you need help quitting, ask your health care provider.  Take medicines only as directed by your health care provider.  Keep all follow-up visits as directed by your health care provider. This is important. Contact a health care provider if:  You have a fever.  Your pain medicine is not helping.  Your toe is cold.  Your toe is numb.  You still have pain after one week of rest and treatment.  You still have pain after your health care provider has said that you can start walking again.  You have pain, tingling, or numbness in your foot that is not going away. Get help right away if:  You have severe pain.  You have redness or inflammation in your toe that is getting worse.  You have pain or numbness in your toe that is getting worse.  Your toe turns blue. This information is not intended to replace advice given to you by your health care provider. Make sure you discuss any questions you have with your health care provider. Document Released: 08/19/2000 Document Revised: 04/25/2016 Document Reviewed: 06/18/2014 Elsevier Interactive Patient Education  [employer]2018 [employer] Inc[employer]

## 2018-08-14 NOTE — Progress Notes (Signed)
[name] Green is a 37 y.o. female who presents to [facility] today for right great toe injury.  [name] Green injured her right great toe MTP joint playing soccer about 3 or 4 days ago.  She notes pain and swelling with is bit of limping.  She notes some bruising around the first MTP.  She is tried over-the-counter medications and ice which helped a bit.  No fevers or chills nausea vomiting or diarrhea.    ROS:  As above  Exam:  BP 125/65   Pulse 69   Wt 127 lb (57.6 kg)   BMI 22.50 kg/m  General: Well Developed, well nourished, and in no acute distress.  Neuro/Psych: Alert and oriented x3, extra-ocular muscles intact, able to move all 4 extremities, sensation grossly intact. Skin: Warm and dry, no rashes noted.  Respiratory: Not using accessory muscles, speaking in full sentences, trachea midline.  Cardiovascular: Pulses palpable, no extremity edema. Abdomen: Does not appear distended. MSK: Right foot ecchymosis and tender to palpation right first MTP.  Decreased motion.  Sensation cap refill intact distally.  Foot is otherwise normal-appearing and nontender with normal pulses.    Lab and Radiology Results Xray images right 1st toe personally independently reviewed Small fracture at the medial border of the proximal end of the proximal phalanx at first MTP. Await formal radiology review   Assessment and Plan: 37 y.o. female with right great toe fracture at MTP.  Plan for cam walker boot and activity as tolerated.  Ibuprofen or Tylenol as needed.  Recheck in 2 weeks or so.  Return sooner if needed.    Orders Placed This Encounter  Procedures  . DG Toe Great Right    Order Specific Question:   Reason for exam:    Answer:   toe injury    Order Specific Question:   Is the patient pregnant?    Answer:   No    Order Specific Question:   Preferred imaging location?    Answer:   [facility]   No orders of the defined  types were placed in this encounter.   Historical information moved to improve visibility of documentation.  Past Medical History:  Diagnosis Date  . Asthma    exercise induced, has not used inhaler in over a year  . Hernia   . Medical history non-contributory   . Migraines    Past Surgical History:  Procedure Laterality Date  . ANTERIOR CRUCIATE LIGAMENT REPAIR  02/2013  . CYSTECTOMY  1992  . HERNIA REPAIR  07/15/11   umb hernia  . INGUINAL HERNIA REPAIR     < 3 yo  . WISDOM TOOTH EXTRACTION  2004   Social History   Tobacco Use  . Smoking status: Never Smoker  . Smokeless tobacco: Never Used  Substance Use Topics  . Alcohol use: Yes    Comment: occasionally   family history includes Cancer in her father; Diabetes in her unknown relative; Heart attack in her unknown relative; Heart disease in her paternal grandfather; Lung cancer in her unknown relative; Stroke in her unknown relative.  Medications: Current Outpatient Medications  Medication Sig Dispense Refill  . acetaminophen (TYLENOL) 325 MG tablet Take 650 mg by mouth every 6 (six) hours as needed.    Marland Kitchen albuterol (PROVENTIL HFA;VENTOLIN HFA) 108 (90 Base) MCG/ACT inhaler Inhale 2 puffs into the lungs every 6 (six) hours as needed. To prevent asthma attack 2 Inhaler 11  . ibuprofen (ADVIL,MOTRIN) 600 MG  tablet Take 1 tablet (600 mg total) by mouth every 6 (six) hours. 30 tablet 0  . Pediatric Multivit-Minerals-C (MULTIVITAMIN GUMMIES CHILDRENS) CHEW Chew by mouth.    . SUMAtriptan (IMITREX) 100 MG tablet Take 1 tablet (100 mg total) by mouth every 2 (two) hours as needed for migraine. May repeat in 2 hours if headache persists or recurs. 10 tablet 0   No current facility-administered medications for this visit.    Allergies  Allergen Reactions  . Amoxicillin Hives      Discussed warning signs or symptoms. Please see discharge instructions. Patient expresses understanding.

## 2018-08-24 ENCOUNTER — Ambulatory Visit: Payer: 59 | Admitting: Certified Nurse Midwife

## 2018-08-24 ENCOUNTER — Encounter: Payer: Self-pay | Admitting: Certified Nurse Midwife

## 2018-08-24 VITALS — BP 102/64 | HR 67 | Resp 16 | Ht 63.0 in | Wt 130.0 lb

## 2018-08-24 DIAGNOSIS — Z01419 Encounter for gynecological examination (general) (routine) without abnormal findings: Secondary | ICD-10-CM | POA: Diagnosis not present

## 2018-08-24 DIAGNOSIS — T8332XA Displacement of intrauterine contraceptive device, initial encounter: Secondary | ICD-10-CM | POA: Diagnosis not present

## 2018-08-24 DIAGNOSIS — Z124 Encounter for screening for malignant neoplasm of cervix: Secondary | ICD-10-CM | POA: Diagnosis not present

## 2018-08-24 DIAGNOSIS — Z1151 Encounter for screening for human papillomavirus (HPV): Secondary | ICD-10-CM

## 2018-08-24 DIAGNOSIS — Z30432 Encounter for removal of intrauterine contraceptive device: Secondary | ICD-10-CM

## 2018-08-24 NOTE — Progress Notes (Signed)
GYNECOLOGY ANNUAL PREVENTATIVE CARE ENCOUNTER NOTE  Subjective:   [name] Green is a 37 y.o. [id number] female here for a routine annual gynecologic exam.  Current complaints: irregular bleeding with IUD, request IUD removal.   Denies discharge, pelvic pain, problems with intercourse or other gynecologic concerns.    Gynecologic History Patient's last menstrual period was 08/24/2018. Contraception: IUD Last Pap: 2016. Results were: normal with negative HPV  Obstetric History OB History  Gravida Para Term Preterm AB Living  3 3 3  0 0 3  SAB TAB Ectopic Multiple Live Births  0 0 0 0 2    # Outcome Date GA Lbr Len/2nd Weight Sex Delivery Anes PTL Lv  3 Term 03/12/14 501w5d 15:50 / 00:19 7 lb 4.6 oz (3.306 kg) M Vag-Spont EPI  LIV  2 Term 03/19/11 [id number]  7 lb 12 oz (3.515 kg) F Vag-Spont     1 Term 06/02/09 7892w0d  7 lb 8 oz (3.402 kg) F Vag-Spont EPI N LIV     Birth Comments: Vacuum delivery    Past Medical History:  Diagnosis Date  . Asthma    exercise induced, has not used inhaler in over a year  . Hernia   . Medical history non-contributory   . Migraines     Past Surgical History:  Procedure Laterality Date  . ANTERIOR CRUCIATE LIGAMENT REPAIR  02/2013  . CYSTECTOMY  1992  . HERNIA REPAIR  07/15/11   umb hernia  . INGUINAL HERNIA REPAIR     < 3 yo  . WISDOM TOOTH EXTRACTION  2004    Current Outpatient Medications on File Prior to Visit  Medication Sig Dispense Refill  . acetaminophen (TYLENOL) 325 MG tablet Take 650 mg by mouth every 6 (six) hours as needed.    Marland Kitchen. albuterol (PROVENTIL HFA;VENTOLIN HFA) 108 (90 Base) MCG/ACT inhaler Inhale 2 puffs into the lungs every 6 (six) hours as needed. To prevent asthma attack 2 Inhaler 11  . ibuprofen (ADVIL,MOTRIN) 600 MG tablet Take 1 tablet (600 mg total) by mouth every 6 (six) hours. 30 tablet 0  . Pediatric Multivit-Minerals-C (MULTIVITAMIN GUMMIES CHILDRENS) CHEW Chew by mouth.    . SUMAtriptan (IMITREX) 100 MG  tablet Take 1 tablet (100 mg total) by mouth every 2 (two) hours as needed for migraine. May repeat in 2 hours if headache persists or recurs. 10 tablet 0   No current facility-administered medications on file prior to visit.     Allergies  Allergen Reactions  . Amoxicillin Hives    Social History:  reports that she has never smoked. She has never used smokeless tobacco. She reports current alcohol use. She reports that she does not use drugs.  Family History  Problem Relation Age of Onset  . Cancer Father        prostate  . Diabetes Other        grandparent  . Stroke Other        grandparent  . Heart attack Other        grandparent  . Lung cancer Other        grandparent  . Heart disease Paternal Grandfather     The following portions of the patient's history were reviewed and updated as appropriate: allergies, current medications, past family history, past medical history, past social history, past surgical history and problem list.  Review of Systems Pertinent items noted in HPI and remainder of comprehensive ROS otherwise negative.   Objective:  BP 102/64  Pulse 67   Resp 16   Ht 5\' 3"  (1.6 m)   Wt 130 lb (59 kg)   LMP 08/24/2018   Breastfeeding No   BMI 23.03 kg/m  CONSTITUTIONAL: Well-developed, well-nourished female in no acute distress.  HENT:  Normocephalic, atraumatic, External right and left ear normal. Oropharynx is clear and moist EYES: Conjunctivae and EOM are normal. Pupils are equal, round, and reactive to light. Marland Kitchen.  NECK: Normal range of motion, supple, no masses.  Normal thyroid.  SKIN: Skin is warm and dry. No rash noted. Not diaphoretic. No erythema. No pallor. MUSCULOSKELETAL: Normal range of motion. No tenderness.  No cyanosis, clubbing, or edema.  2+ distal pulses. NEUROLOGIC: Alert and oriented to person, place, and time. Normal reflexes, muscle tone coordination. No cranial nerve deficit noted. PSYCHIATRIC: Normal mood and affect. Normal  behavior. Normal judgment and thought content. CARDIOVASCULAR: Normal heart rate noted, regular rhythm RESPIRATORY: Clear to auscultation bilaterally. Effort and breath sounds normal, no problems with respiration noted. BREASTS: Symmetric in size. No masses, skin changes, nipple drainage, or lymphadenopathy. ABDOMEN: Soft, normal bowel sounds, no distention noted.  No tenderness, rebound or guarding.  PELVIC: Normal appearing external genitalia; normal appearing vaginal mucosa and cervix.  No abnormal discharge noted.  Pap smear obtained. IUD strings not seen.  Normal uterine size, no other palpable masses, no uterine or adnexal tenderness.  PROCEDURES: IUD Removal  Patient was in the dorsal lithotomy position, normal external genitalia was noted.  A speculum was placed in the patient's vagina, normal discharge was noted, no lesions. The multiparous cervix was visualized, no lesions, no abnormal discharge. The strings of the IUD were not visualized, so Kelly forceps were introduced into the endometrial cavity, after multiple tries, IUD removal unsuccessful.    Assessment and Plan:  1. Well woman exam with routine gynecological exam - Normal well woman appointment  - Cytology - PAP( [place])  2. IUD threads lost  - Unsuccessful removal of IUD   Will follow up results of pap smear and manage accordingly. Routine preventative health maintenance measures emphasized. Please refer to After Visit Summary for other counseling recommendations.  Follow up on Monday for IUD removal    Sharyon CableVeronica C Elchanan Bob, CNM [facility]

## 2018-08-27 ENCOUNTER — Ambulatory Visit: Payer: 59 | Admitting: Obstetrics & Gynecology

## 2018-08-27 ENCOUNTER — Encounter: Payer: Self-pay | Admitting: Obstetrics & Gynecology

## 2018-08-27 ENCOUNTER — Encounter: Payer: Self-pay | Admitting: Certified Nurse Midwife

## 2018-08-27 VITALS — BP 110/72 | HR 84 | Resp 16 | Ht 63.0 in | Wt 130.0 lb

## 2018-08-27 DIAGNOSIS — T8332XD Displacement of intrauterine contraceptive device, subsequent encounter: Secondary | ICD-10-CM

## 2018-08-27 LAB — CYTOLOGY - PAP
Diagnosis: NEGATIVE
HPV: NOT DETECTED

## 2018-08-27 MED ORDER — ALPRAZOLAM 1 MG PO TABS: 1.0000 mg | ORAL_TABLET | Freq: Every evening | ORAL | 0 refills | Status: DC | PRN

## 2018-08-27 MED ORDER — MISOPROSTOL 200 MCG PO TABS: ORAL_TABLET | ORAL | 0 refills | Status: DC

## 2018-08-27 NOTE — Progress Notes (Signed)
   Subjective:    Patient ID: [name] Green, female    DOB: 06/29/1981, 37 y.o.   MRN: [id number]  HPI 37 yo married P3 here for IUD removal. She has been having irregular bleeding and her husband has had a vasectomy. There was an unsuccessful attempt to remove it recently.   Review of Systems     Objective:   Physical Exam Breathing, conversing, and ambulating normally Well nourished, well hydrated White female, no apparent distress Ultrasound confirms presence of IUD Cervix prepped with iodine and grasped with single tooth tenaculum Cervix dilated gently to accomodate IUD hook, no success I tried placing the tenaculum on the posterior lip of cervix and tried a different type of IUD hook, still no success.     Assessment & Plan:  Retained IUD- I offered her removal in the OR versus [facility] with hysteroscopy. She opts for outpatient trial. Cytotec and xanax prescribed.

## 2018-09-06 ENCOUNTER — Ambulatory Visit (INDEPENDENT_AMBULATORY_CARE_PROVIDER_SITE_OTHER): Payer: 59

## 2018-09-06 ENCOUNTER — Ambulatory Visit: Payer: 59 | Admitting: Family Medicine

## 2018-09-06 ENCOUNTER — Encounter: Payer: Self-pay | Admitting: Family Medicine

## 2018-09-06 VITALS — BP 114/73 | HR 71 | Ht 63.0 in | Wt 132.0 lb

## 2018-09-06 DIAGNOSIS — X58XXXD Exposure to other specified factors, subsequent encounter: Secondary | ICD-10-CM

## 2018-09-06 DIAGNOSIS — S92411D Displaced fracture of proximal phalanx of right great toe, subsequent encounter for fracture with routine healing: Secondary | ICD-10-CM | POA: Diagnosis not present

## 2018-09-06 DIAGNOSIS — S92414A Nondisplaced fracture of proximal phalanx of right great toe, initial encounter for closed fracture: Secondary | ICD-10-CM

## 2018-09-06 DIAGNOSIS — S99921A Unspecified injury of right foot, initial encounter: Secondary | ICD-10-CM | POA: Diagnosis not present

## 2018-09-06 DIAGNOSIS — S92411A Displaced fracture of proximal phalanx of right great toe, initial encounter for closed fracture: Secondary | ICD-10-CM | POA: Diagnosis not present

## 2018-09-06 DIAGNOSIS — Y9366 Activity, soccer: Secondary | ICD-10-CM | POA: Diagnosis not present

## 2018-09-06 NOTE — Patient Instructions (Signed)
Thank you for coming in today. Continue relative rest with the boot.  Recheck in 2-3 weeks.  Make sure you are taking calcium and vit D.  About 1000mg  calcium twice daily and 2000 units of vit d a day.

## 2018-09-06 NOTE — Progress Notes (Signed)
[name] Green is a 38 y.o. female who [facility] today for follow-up right great toe fracture.  Patient was seen on December 10 for fracture right great toe after an injury playing soccer on approximately December 6 or 7.  She was treated with a Cam walker boot.  In the interim she notes that she has felt pretty well with only a little bit of pain.  She is able to walk normally with a Cam walker.    ROS:  As above  Exam:  BP 114/73   Pulse 71   Ht 5\' 3"  (1.6 m)   Wt 132 lb (59.9 kg)   LMP 08/24/2018   BMI 23.38 kg/m  Wt Readings from Last 5 Encounters:  09/06/18 132 lb (59.9 kg)  08/27/18 130 lb (59 kg)  08/24/18 130 lb (59 kg)  08/14/18 127 lb (57.6 kg)  09/20/17 127 lb (57.6 kg)   General: Well Developed, well nourished, and in no acute distress.  Neuro/Psych: Alert and oriented x3, extra-ocular muscles intact, able to move all 4 extremities, sensation grossly intact. Skin: Warm and dry, no rashes noted.  Respiratory: Not using accessory muscles, speaking in full sentences, trachea midline.  Cardiovascular: Pulses palpable, no extremity edema. Abdomen: Does not appear distended. MSK: Right foot not particularly tender normal motion.    Lab and Radiology Results No results found for this or any previous visit (from the past 72 hour(s)). Dg Toe Great Right  Result Date: 09/06/2018 CLINICAL DATA:  Root follow-up fracture of great toe. EXAM: RIGHT GREAT TOE COMPARISON:  Right great toe radiograph of August 14, 2018 FINDINGS: Again demonstrated is an obliquely oriented intra-articular fracture involving the medial aspect of the base of proximal phalanx of the great toe. Mild distraction of the fracture fragments is present and stable. No periosteal reaction is observed. IMPRESSION: Stable appearance of mildly distracted obliquely oriented fracture through the medial aspect base proximal phalanx of the right great  toe. Fracture reaches the MTP joint surface. No periosteal reaction is observed. Electronically Signed   By: David  Swazil[place] M.D.   On: 09/06/2018 09:05   I personally (independently) visualized and performed the interpretation of the images attached in this note.     Assessment and Plan: 38 y.o. female with right great toe fracture.  Not fantastic healing on x-ray today.  Plan for continued CAM Walker boot and relative rest.  Recommend calcium and vitamin D.  Recheck in about 2 weeks.   PDMP not reviewed this encounter. Orders Placed This Encounter  Procedures  . DG Toe Great Right    Order Specific Question:   Reason for exam:    Answer:   f/u fx    Order Specific Question:   Is the patient pregnant?    Answer:   No    Order Specific Question:   Preferred imaging location?    [facility]   No orders of the defined types were placed in this encounter.   Historical information moved to improve visibility of documentation.  Past Medical History:  Diagnosis Date  . Asthma    exercise induced, has not used inhaler in over a year  . Hernia   . Medical history non-contributory   . Migraines    Past Surgical History:  Procedure Laterality Date  . ANTERIOR CRUCIATE LIGAMENT REPAIR  02/2013  . CYSTECTOMY  1992  . HERNIA REPAIR  07/15/11   umb hernia  .  INGUINAL HERNIA REPAIR     < 3 yo  . WISDOM TOOTH EXTRACTION  2004   Social History   Tobacco Use  . Smoking status: Never Smoker  . Smokeless tobacco: Never Used  Substance Use Topics  . Alcohol use: Yes    Comment: occasionally   family history includes Cancer in her father; Diabetes in an other family member; Heart attack in an other family member; Heart disease in her paternal grandfather; Lung cancer in an other family member; Stroke in an other family member.  Medications: Current Outpatient Medications  Medication Sig Dispense Refill  . acetaminophen (TYLENOL) 325 MG tablet Take 650 mg by mouth  every 6 (six) hours as needed.    Marland Kitchen albuterol (PROVENTIL HFA;VENTOLIN HFA) 108 (90 Base) MCG/ACT inhaler Inhale 2 puffs into the lungs every 6 (six) hours as needed. To prevent asthma attack 2 Inhaler 11  . ALPRAZolam (XANAX) 1 MG tablet Take 1 tablet (1 mg total) by mouth at bedtime as needed for anxiety. 10 tablet 0  . ibuprofen (ADVIL,MOTRIN) 600 MG tablet Take 1 tablet (600 mg total) by mouth every 6 (six) hours. 30 tablet 0  . misoprostol (CYTOTEC) 200 MCG tablet Take 3 pills by mouth the night before procedure. 3 tablet 0  . Pediatric Multivit-Minerals-C (MULTIVITAMIN GUMMIES CHILDRENS) CHEW Chew by mouth.    . SUMAtriptan (IMITREX) 100 MG tablet Take 1 tablet (100 mg total) by mouth every 2 (two) hours as needed for migraine. May repeat in 2 hours if headache persists or recurs. 10 tablet 0   No current facility-administered medications for this visit.    Allergies  Allergen Reactions  . Amoxicillin Hives      Discussed warning signs or symptoms. Please see discharge instructions. Patient expresses understanding.

## 2018-09-17 ENCOUNTER — Ambulatory Visit: Payer: 59 | Admitting: Obstetrics & Gynecology

## 2018-09-19 ENCOUNTER — Encounter: Payer: Self-pay | Admitting: Family Medicine

## 2018-09-19 ENCOUNTER — Ambulatory Visit (INDEPENDENT_AMBULATORY_CARE_PROVIDER_SITE_OTHER): Payer: 59

## 2018-09-19 ENCOUNTER — Ambulatory Visit (INDEPENDENT_AMBULATORY_CARE_PROVIDER_SITE_OTHER): Payer: 59 | Admitting: Family Medicine

## 2018-09-19 VITALS — BP 118/60 | HR 71 | Ht 63.0 in | Wt 127.0 lb

## 2018-09-19 DIAGNOSIS — S99921A Unspecified injury of right foot, initial encounter: Secondary | ICD-10-CM | POA: Diagnosis not present

## 2018-09-19 DIAGNOSIS — X58XXXA Exposure to other specified factors, initial encounter: Secondary | ICD-10-CM

## 2018-09-19 DIAGNOSIS — S92411A Displaced fracture of proximal phalanx of right great toe, initial encounter for closed fracture: Secondary | ICD-10-CM | POA: Diagnosis not present

## 2018-09-19 NOTE — Progress Notes (Signed)
[name] Green is a 38 y.o. female who presents to [facility] today for right great toe fracture. [name] Green suffered fracture proximal medial proximal phalanx at first MTP in early December.  She was first seen for this on December 10 and has been treated with a Cam walker boot with ambulation as tolerated until today.  She notes her pain is completely resolved.  She denies any radiating pain weakness or numbness fevers or chills.    ROS:  As above  Exam:  BP 118/60   Pulse 71   Ht 5\' 3"  (1.6 m)   Wt 127 lb (57.6 kg)   LMP 08/24/2018   BMI 22.50 kg/m  Wt Readings from Last 5 Encounters:  09/19/18 127 lb (57.6 kg)  09/06/18 132 lb (59.9 kg)  08/27/18 130 lb (59 kg)  08/24/18 130 lb (59 kg)  08/14/18 127 lb (57.6 kg)   General: Well Developed, well nourished, and in no acute distress.  Neuro/Psych: Alert and oriented x3, extra-ocular muscles intact, able to move all 4 extremities, sensation grossly intact. Skin: Warm and dry, no rashes noted.  Respiratory: Not using accessory muscles, speaking in full sentences, trachea midline.  Cardiovascular: Pulses palpable, no extremity edema. Abdomen: Does not appear distended. MSK: Right toe normal-appearing nontender normal motion.    Lab and Radiology Results No results found for this or any previous visit (from the past 72 hour(s)). Dg Toe Great Right  Result Date: 09/19/2018 CLINICAL DATA:  Soccer injury, toe fracture EXAM: RIGHT GREAT TOE COMPARISON:  09/06/2018 FINDINGS: Stable appearance of the right great toe proximal phalanx nondisplaced intra-articular fracture medially. No evidence of significant healing radiographically. Mild soft tissue prominence. No malalignment. IMPRESSION: Stable appearance of the right great toe proximal phalanx intra-articular fracture. No significant radiographic healing compared to 09/06/2018 Electronically Signed   By: Judie Petit.  Shick M.D.   On: 09/19/2018  09:32  I personally (independently) visualized and performed the interpretation of the images attached in this note.      Assessment and Plan: 38 y.o. female with right great toe fracture.  Not significant healing since original fracture on December 10.  Clinically however doing great.  Plan to transition to turf toe plate with regular shoes and ambulation as tolerated.  Recommend against running.  Recheck in about 3 weeks.  Discussion today regarding potential for nonunion or fibrous union and how big of a risk that would be.  She is willing to have more freedom with slightly increased risk of not fully bony healing.  I spent 15 minutes with this patient, greater than 50% was face-to-face time counseling regarding diagnosis treatment plan and options as above.Marland Kitchen   PDMP not reviewed this encounter. Orders Placed This Encounter  Procedures  . DG Toe Great Right    Order Specific Question:   Reason for exam:    Answer:   toe fracture    Order Specific Question:   Is the patient pregnant?    Answer:   No    Order Specific Question:   Preferred imaging location?    Answer:   [facility]   No orders of the defined types were placed in this encounter.   Historical information moved to improve visibility of documentation.  Past Medical History:  Diagnosis Date  . Asthma    exercise induced, has not used inhaler in over a year  . Hernia   . Medical history non-contributory   . Migraines    Past Surgical  History:  Procedure Laterality Date  . ANTERIOR CRUCIATE LIGAMENT REPAIR  02/2013  . CYSTECTOMY  1992  . HERNIA REPAIR  07/15/11   umb hernia  . INGUINAL HERNIA REPAIR     < 3 yo  . WISDOM TOOTH EXTRACTION  2004   Social History   Tobacco Use  . Smoking status: Never Smoker  . Smokeless tobacco: Never Used  Substance Use Topics  . Alcohol use: Yes    Comment: occasionally   family history includes Cancer in her father; Diabetes in an other family member; Heart  attack in an other family member; Heart disease in her paternal grandfather; Lung cancer in an other family member; Stroke in an other family member.  Medications: Current Outpatient Medications  Medication Sig Dispense Refill  . acetaminophen (TYLENOL) 325 MG tablet Take 650 mg by mouth every 6 (six) hours as needed.    Marland Kitchen. albuterol (PROVENTIL HFA;VENTOLIN HFA) 108 (90 Base) MCG/ACT inhaler Inhale 2 puffs into the lungs every 6 (six) hours as needed. To prevent asthma attack 2 Inhaler 11  . ALPRAZolam (XANAX) 1 MG tablet Take 1 tablet (1 mg total) by mouth at bedtime as needed for anxiety. 10 tablet 0  . ibuprofen (ADVIL,MOTRIN) 600 MG tablet Take 1 tablet (600 mg total) by mouth every 6 (six) hours. 30 tablet 0  . misoprostol (CYTOTEC) 200 MCG tablet Take 3 pills by mouth the night before procedure. 3 tablet 0  . Pediatric Multivit-Minerals-C (MULTIVITAMIN GUMMIES CHILDRENS) CHEW Chew by mouth.    . SUMAtriptan (IMITREX) 100 MG tablet Take 1 tablet (100 mg total) by mouth every 2 (two) hours as needed for migraine. May repeat in 2 hours if headache persists or recurs. 10 tablet 0   No current facility-administered medications for this visit.    Allergies  Allergen Reactions  . Amoxicillin Hives      Discussed warning signs or symptoms. Please see discharge instructions. Patient expresses understanding.

## 2018-09-19 NOTE — Patient Instructions (Addendum)
Thank you for coming in today.  Morton's Toe Carbon Fiber Contoured Insoles  Ok to advance activity as tolerated. No running however.   Recheck in 3 weeks.  Will determine return to running then.   Continue calcium and vit D.

## 2018-09-21 ENCOUNTER — Ambulatory Visit: Payer: 59 | Admitting: Family Medicine

## 2018-10-10 ENCOUNTER — Ambulatory Visit (INDEPENDENT_AMBULATORY_CARE_PROVIDER_SITE_OTHER): Payer: 59

## 2018-10-10 ENCOUNTER — Encounter: Payer: Self-pay | Admitting: Family Medicine

## 2018-10-10 ENCOUNTER — Ambulatory Visit (INDEPENDENT_AMBULATORY_CARE_PROVIDER_SITE_OTHER): Payer: 59 | Admitting: Family Medicine

## 2018-10-10 VITALS — BP 103/62 | HR 83 | Temp 97.8°F | Wt 128.0 lb

## 2018-10-10 DIAGNOSIS — S92411A Displaced fracture of proximal phalanx of right great toe, initial encounter for closed fracture: Secondary | ICD-10-CM | POA: Diagnosis not present

## 2018-10-10 DIAGNOSIS — X58XXXD Exposure to other specified factors, subsequent encounter: Secondary | ICD-10-CM

## 2018-10-10 DIAGNOSIS — S92411D Displaced fracture of proximal phalanx of right great toe, subsequent encounter for fracture with routine healing: Secondary | ICD-10-CM | POA: Diagnosis not present

## 2018-10-10 DIAGNOSIS — S92414A Nondisplaced fracture of proximal phalanx of right great toe, initial encounter for closed fracture: Secondary | ICD-10-CM

## 2018-10-10 NOTE — Patient Instructions (Signed)
Thank you for coming in today.  Continue activity as tolerated  Ok to wean out of toe plate.  If having go back to toe plate.   Recheck in 1 month.

## 2018-10-10 NOTE — Progress Notes (Signed)
[name] Green is a 38 y.o. female who presents to [facility] today for right great toe injury.  Patient suffered a tiny avulsion fracture at the proximal end of the proximal phalanx at the medial corner.  This injury occurred in early December and she was seen for her first visit December 10.  She was treated conservatively with Cam walker boot and weightbearing as tolerated.  She continued to advance clinically and was feeling a lot better.  She was last seen on January 15.  At that point she was transitioned to rigid toe plate with a sneaker.  She notes that with the rigid turf toe plate she feels great.  She denies significant pain.  She is able to walk without pain.  She is able to use an elliptical machine without pain.  She would like to get back to running if possible.     ROS:  As above  Exam:  BP 103/62   Pulse 83   Temp 97.8 F (36.6 C) (Oral)   Wt 128 lb (58.1 kg)   BMI 22.67 kg/m  Wt Readings from Last 5 Encounters:  10/10/18 128 lb (58.1 kg)  09/19/18 127 lb (57.6 kg)  09/06/18 132 lb (59.9 kg)  08/27/18 130 lb (59 kg)  08/24/18 130 lb (59 kg)   General: Well Developed, well nourished, and in no acute distress.  Neuro/Psych: Alert and oriented x3, extra-ocular muscles intact, able to move all 4 extremities, sensation grossly intact. Skin: Warm and dry, no rashes noted.  Respiratory: Not using accessory muscles, speaking in full sentences, trachea midline.  Cardiovascular: Pulses palpable, no extremity edema. Abdomen: Does not appear distended. MSK: Right foot normal-appearing nontender normal motion.    Lab and Radiology Results X-ray images personally independently reviewed right great toe.  Minimal healing at avulsion fracture medial proximal end of the proximal phalanx first MTP. Await formal radiology review    Assessment and Plan: 38 y.o. female with right toe fracture.  Been about 2 months.  Patient has  had significant clinical improvement with minimal healing on x-ray.  Plan to continue the toe plate and advance activity as tolerated.  Okay to try weaning out of toe plate.  Discussed surgical referral.  Recheck in 1 month.  Return sooner if needed.     Orders Placed This Encounter  Procedures  . DG Toe Great Right    Order Specific Question:   Reason for Exam (SYMPTOM  OR DIAGNOSIS REQUIRED)    Answer:   fracture    Order Specific Question:   Is patient pregnant?    Answer:   No    Order Specific Question:   Preferred imaging location?    Answer:   [facility]    Order Specific Question:   Radiology Contrast Protocol - do NOT remove file path    Answer:   \\[email or web address]   No orders of the defined types were placed in this encounter.   Historical information moved to improve visibility of documentation.  Past Medical History:  Diagnosis Date  . Asthma    exercise induced, has not used inhaler in over a year  . Hernia   . Medical history non-contributory   . Migraines    Past Surgical History:  Procedure Laterality Date  . ANTERIOR CRUCIATE LIGAMENT REPAIR  02/2013  . CYSTECTOMY  1992  . HERNIA REPAIR  07/15/11   umb hernia  . INGUINAL HERNIA REPAIR     < 3  yo  . WISDOM TOOTH EXTRACTION  2004   Social History   Tobacco Use  . Smoking status: Never Smoker  . Smokeless tobacco: Never Used  Substance Use Topics  . Alcohol use: Yes    Comment: occasionally   family history includes Cancer in her father; Diabetes in an other family member; Heart attack in an other family member; Heart disease in her paternal grandfather; Lung cancer in an other family member; Stroke in an other family member.  Medications: Current Outpatient Medications  Medication Sig Dispense Refill  . acetaminophen (TYLENOL) 325 MG tablet Take 650 mg by mouth every 6 (six) hours as needed.    Marland Kitchen. albuterol (PROVENTIL HFA;VENTOLIN HFA) 108 (90 Base)  MCG/ACT inhaler Inhale 2 puffs into the lungs every 6 (six) hours as needed. To prevent asthma attack 2 Inhaler 11  . ALPRAZolam (XANAX) 1 MG tablet Take 1 tablet (1 mg total) by mouth at bedtime as needed for anxiety. 10 tablet 0  . ibuprofen (ADVIL,MOTRIN) 600 MG tablet Take 1 tablet (600 mg total) by mouth every 6 (six) hours. 30 tablet 0  . Pediatric Multivit-Minerals-C (MULTIVITAMIN GUMMIES CHILDRENS) CHEW Chew by mouth.    . SUMAtriptan (IMITREX) 100 MG tablet Take 1 tablet (100 mg total) by mouth every 2 (two) hours as needed for migraine. May repeat in 2 hours if headache persists or recurs. 10 tablet 0   No current facility-administered medications for this visit.    Allergies  Allergen Reactions  . Amoxicillin Hives      Discussed warning signs or symptoms. Please see discharge instructions. Patient expresses understanding.

## 2018-10-19 ENCOUNTER — Encounter (HOSPITAL_COMMUNITY): Payer: Self-pay

## 2018-11-08 ENCOUNTER — Ambulatory Visit (INDEPENDENT_AMBULATORY_CARE_PROVIDER_SITE_OTHER): Payer: 59

## 2018-11-08 ENCOUNTER — Encounter: Payer: Self-pay | Admitting: Family Medicine

## 2018-11-08 ENCOUNTER — Ambulatory Visit (INDEPENDENT_AMBULATORY_CARE_PROVIDER_SITE_OTHER): Payer: 59 | Admitting: Family Medicine

## 2018-11-08 VITALS — BP 134/67 | HR 56 | Wt 133.0 lb

## 2018-11-08 DIAGNOSIS — S92414D Nondisplaced fracture of proximal phalanx of right great toe, subsequent encounter for fracture with routine healing: Secondary | ICD-10-CM

## 2018-11-08 DIAGNOSIS — S92414A Nondisplaced fracture of proximal phalanx of right great toe, initial encounter for closed fracture: Secondary | ICD-10-CM

## 2018-11-08 DIAGNOSIS — X58XXXD Exposure to other specified factors, subsequent encounter: Secondary | ICD-10-CM | POA: Diagnosis not present

## 2018-11-08 DIAGNOSIS — S92411A Displaced fracture of proximal phalanx of right great toe, initial encounter for closed fracture: Secondary | ICD-10-CM | POA: Diagnosis not present

## 2018-11-08 NOTE — Progress Notes (Signed)
[name] Green is a 38 y.o. female who presents to [facility] today for toe fracture.  [name] Green has been seen multiple times for fracture of her right great toe proximal phalanx fracture.  This originally occurred and was seen on December 10.  At her last visit a month ago she clinically was doing extremely well with the rigid turf toe plate.  She been able to walk without pain and he is an elliptical machine without pain.  She was trying to get back to running.  X-ray on February 5 did not show significant change or healing.  She has returned to run and is feeling completely asymptomatic.  She is happy with how things are going.    ROS:  As above  Exam:  BP 134/67   Pulse (!) 56   Wt 133 lb (60.3 kg)   BMI 23.56 kg/m   Wt Readings from Last 5 Encounters:  11/08/18 133 lb (60.3 kg)  10/10/18 128 lb (58.1 kg)  09/19/18 127 lb (57.6 kg)  09/06/18 132 lb (59.9 kg)  08/27/18 130 lb (59 kg)   General: Well Developed, well nourished, and in no acute distress.  Neuro/Psych: Alert and oriented x3, extra-ocular muscles intact, able to move all 4 extremities, sensation grossly intact. Skin: Warm and dry, no rashes noted.  Respiratory: Not using accessory muscles, speaking in full sentences, trachea midline.  Cardiovascular: Pulses palpable, no extremity edema. Abdomen: Does not appear distended. MSK: Right great toe normal-appearing nontender normal motion.    Lab and Radiology Results X-ray images right great toe personally independently reviewed Healing fracture at the proximal end of the proximal phalanx right right toe medial side.  Callus formation improved from previous x-ray Await formal radiology review    Assessment and Plan: 38 y.o. female with clinically doing extremely well.  X-ray also shows healing.  Plan to advance activity as tolerated.  Graduated return to soccer.  Recheck with me as needed.  Has a planned D&C  for IUD removal on March 24.  We discussed that she may want to consider delaying or canceling that procedure if COVID-19 activity is worse than it is currently now.  This would decrease her risk exposure.  I spent 15 minutes with this patient, greater than 50% was face-to-face time counseling regarding fracture diagnosis treatment plan options and discussion about potentially delaying planned surgical procedures.  PDMP not reviewed this encounter. Orders Placed This Encounter  Procedures  . DG Toe Great Right    Standing Status:   Future    Number of Occurrences:   1    Standing Expiration Date:   01/08/2020    Order Specific Question:   Reason for Exam (SYMPTOM  OR DIAGNOSIS REQUIRED)    Answer:   toe fx    Order Specific Question:   Is patient pregnant?    Answer:   No    Order Specific Question:   Preferred imaging location?    Answer[facility]   [facility]    Order Specific Question:   Radiology Contrast Protocol - do NOT remove file path    Answer:   \\[email or web address]   No orders of the defined types were placed in this encounter.   Historical information moved to improve visibility of documentation.  Past Medical History:  Diagnosis Date  . Asthma    exercise induced, has not used inhaler in over a year  . Hernia   . Medical history non-contributory   . Migraines  Past Surgical History:  Procedure Laterality Date  . ANTERIOR CRUCIATE LIGAMENT REPAIR  02/2013  . CYSTECTOMY  1992  . HERNIA REPAIR  07/15/11   umb hernia  . INGUINAL HERNIA REPAIR     < 3 yo  . WISDOM TOOTH EXTRACTION  2004   Social History   Tobacco Use  . Smoking status: Never Smoker  . Smokeless tobacco: Never Used  Substance Use Topics  . Alcohol use: Yes    Comment: occasionally   family history includes Cancer in her father; Diabetes in an other family member; Heart attack in an other family member; Heart disease in her paternal grandfather; Lung  cancer in an other family member; Stroke in an other family member.  Medications: Current Outpatient Medications  Medication Sig Dispense Refill  . acetaminophen (TYLENOL) 325 MG tablet Take 650 mg by mouth every 6 (six) hours as needed.    Marland Kitchen albuterol (PROVENTIL HFA;VENTOLIN HFA) 108 (90 Base) MCG/ACT inhaler Inhale 2 puffs into the lungs every 6 (six) hours as needed. To prevent asthma attack 2 Inhaler 11  . ibuprofen (ADVIL,MOTRIN) 600 MG tablet Take 1 tablet (600 mg total) by mouth every 6 (six) hours. 30 tablet 0  . Pediatric Multivit-Minerals-C (MULTIVITAMIN GUMMIES CHILDRENS) CHEW Chew by mouth.    . SUMAtriptan (IMITREX) 100 MG tablet Take 1 tablet (100 mg total) by mouth every 2 (two) hours as needed for migraine. May repeat in 2 hours if headache persists or recurs. 10 tablet 0   No current facility-administered medications for this visit.    Allergies  Allergen Reactions  . Amoxicillin Hives      Discussed warning signs or symptoms. Please see discharge instructions. Patient expresses understanding.

## 2018-11-08 NOTE — Patient Instructions (Signed)
Thank you for coming in today. Ok to advance running.  Ok to return to soccer. However practice before you play hard.  Recheck with me as needed.

## 2018-11-27 ENCOUNTER — Ambulatory Visit (HOSPITAL_BASED_OUTPATIENT_CLINIC_OR_DEPARTMENT_OTHER): Admit: 2018-11-27 | Payer: 59 | Admitting: Obstetrics & Gynecology

## 2018-11-27 ENCOUNTER — Encounter (HOSPITAL_BASED_OUTPATIENT_CLINIC_OR_DEPARTMENT_OTHER): Payer: Self-pay

## 2018-11-27 SURGERY — REMOVAL, INTRAUTERINE DEVICE
Anesthesia: Choice

## 2019-05-24 IMAGING — DX DG TOE GREAT 2+V*R*
3 series · 3 of 3 positions shown · non-contrast
Comparison: 09/06/2018

CLINICAL DATA: Soccer injury, toe fracture

EXAM:
RIGHT GREAT TOE

[toe ap]
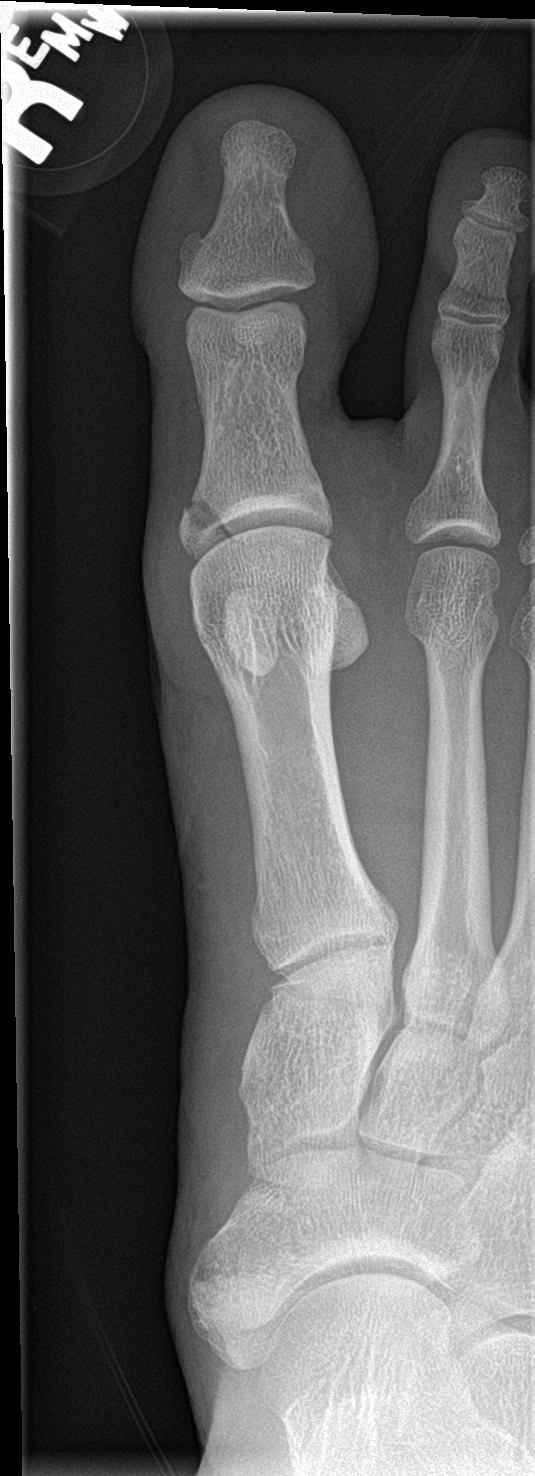

[toe obl]
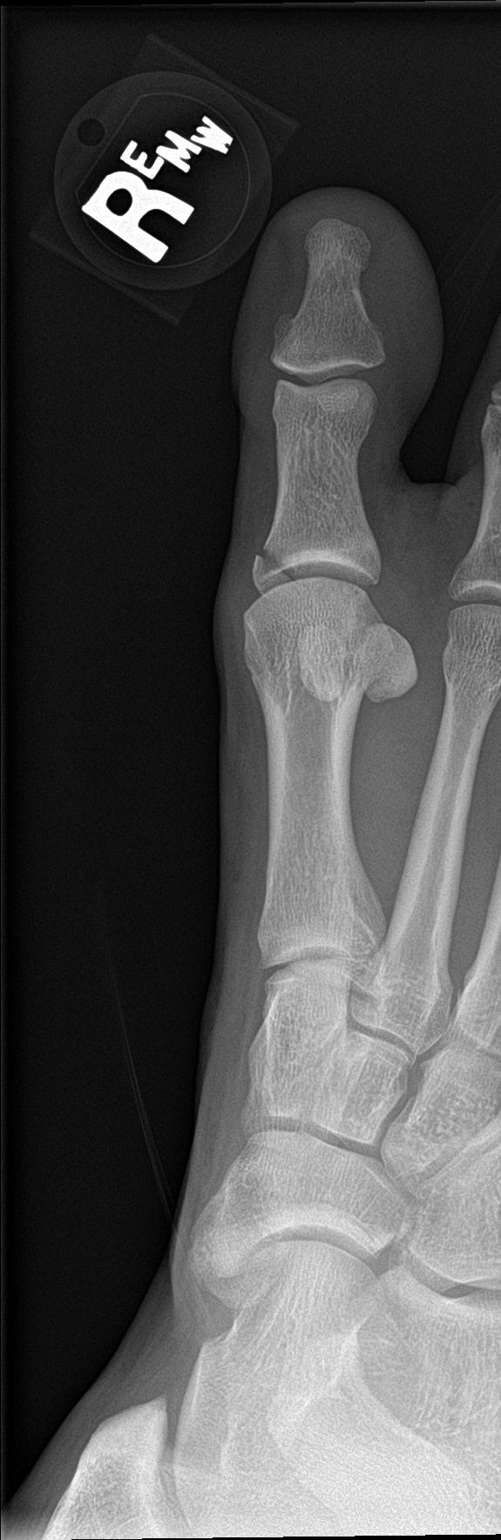

[toe lat]
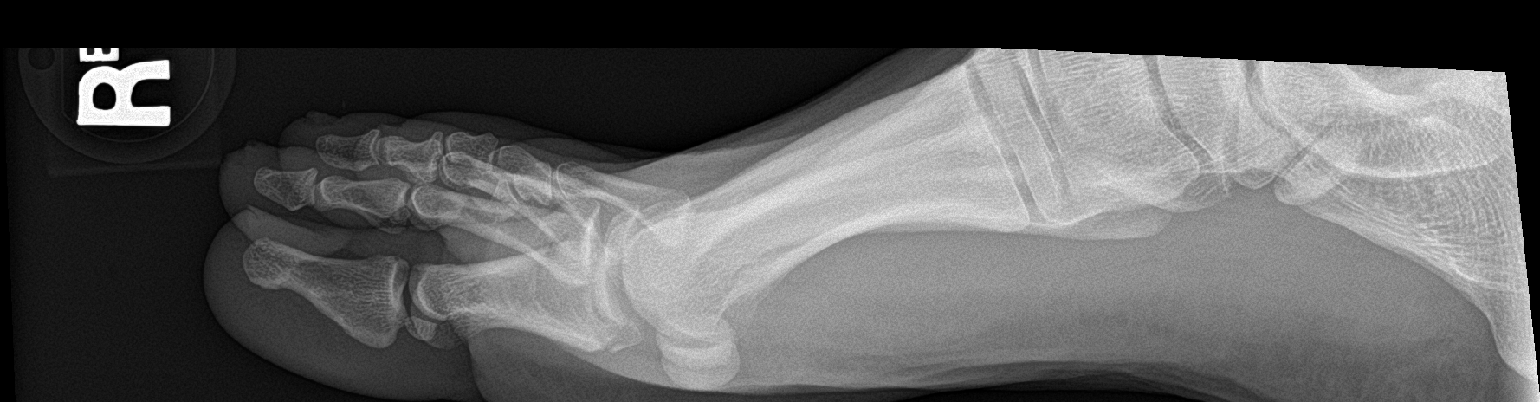

[3 of 3 positions shown; findings below may reference images not displayed]

FINDINGS: Stable appearance of the right great toe proximal phalanx
nondisplaced intra-articular fracture medially. No evidence of
significant healing radiographically. Mild soft tissue prominence.
No malalignment.
IMPRESSION: Stable appearance of the right great toe proximal phalanx
intra-articular fracture. No significant radiographic healing
compared to 09/06/2018

## 2019-06-14 IMAGING — DX DG TOE GREAT 2+V*R*
3 series · 3 of 3 positions shown · non-contrast
Comparison: Right great toe x-ray dated September 19, 2018.

CLINICAL DATA: Fracture follow-up.

EXAM:
RIGHT GREAT TOE

[toe ap]
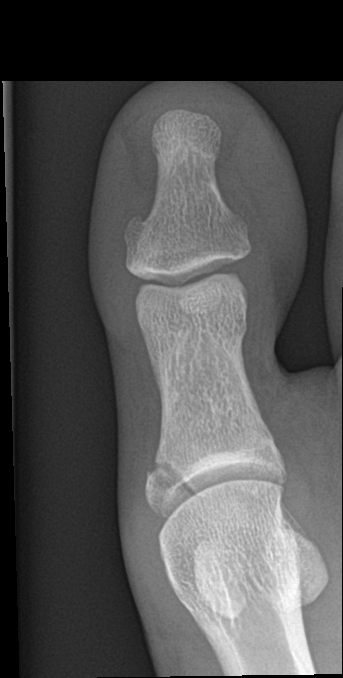

[toe obl]
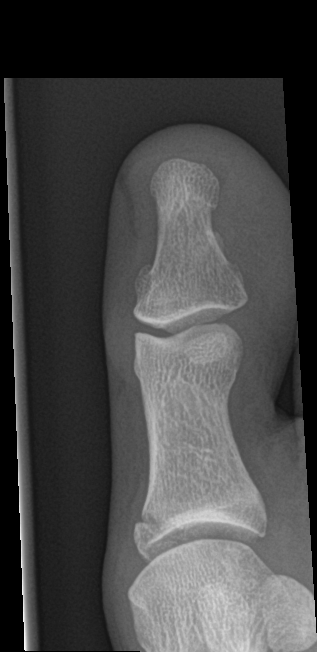

[toe lat]
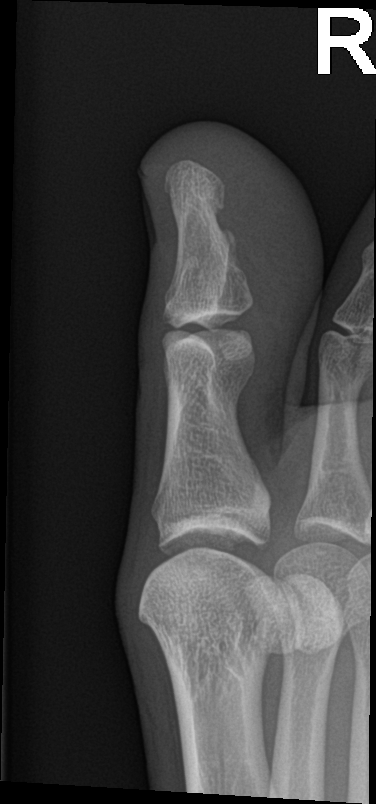

[3 of 3 positions shown; findings below may reference images not displayed]

FINDINGS: No significant interval change in the mildly distracted,
intra-articular, oblique fracture at the medial base of the first
proximal phalanx. No dislocation. Joint spaces are preserved. Bone
mineralization is normal. Soft tissues are unremarkable.
IMPRESSION: 1. Stable appearance of the first proximal phalanx fracture.

## 2019-09-24 ENCOUNTER — Ambulatory Visit (INDEPENDENT_AMBULATORY_CARE_PROVIDER_SITE_OTHER): Payer: 59 | Admitting: Family Medicine

## 2019-09-24 ENCOUNTER — Other Ambulatory Visit: Payer: Self-pay

## 2019-09-24 DIAGNOSIS — Z23 Encounter for immunization: Secondary | ICD-10-CM | POA: Diagnosis not present

## 2019-09-24 NOTE — Progress Notes (Signed)
Pt here for flu shot. Afebrile,no recent illness. Vaccination given, pt tolerated well..[name] Green, CMA  

## 2020-06-08 ENCOUNTER — Other Ambulatory Visit: Payer: Self-pay

## 2020-06-08 ENCOUNTER — Ambulatory Visit (INDEPENDENT_AMBULATORY_CARE_PROVIDER_SITE_OTHER): Payer: 59 | Admitting: Family Medicine

## 2020-06-08 DIAGNOSIS — Z23 Encounter for immunization: Secondary | ICD-10-CM | POA: Diagnosis not present

## 2020-06-08 NOTE — Progress Notes (Signed)
Pt here for flu shot. Afebrile,no recent illness. Vaccination given, pt tolerated well.  

## 2020-07-13 ENCOUNTER — Ambulatory Visit (INDEPENDENT_AMBULATORY_CARE_PROVIDER_SITE_OTHER): Payer: 59

## 2020-07-13 ENCOUNTER — Ambulatory Visit (INDEPENDENT_AMBULATORY_CARE_PROVIDER_SITE_OTHER): Payer: 59 | Admitting: Family Medicine

## 2020-07-13 ENCOUNTER — Other Ambulatory Visit: Payer: Self-pay

## 2020-07-13 ENCOUNTER — Encounter: Payer: Self-pay | Admitting: Family Medicine

## 2020-07-13 VITALS — BP 131/87 | HR 65 | Ht 63.0 in | Wt 130.0 lb

## 2020-07-13 DIAGNOSIS — M79641 Pain in right hand: Secondary | ICD-10-CM

## 2020-07-13 DIAGNOSIS — S6991XA Unspecified injury of right wrist, hand and finger(s), initial encounter: Secondary | ICD-10-CM | POA: Diagnosis not present

## 2020-07-13 NOTE — Patient Instructions (Signed)
Recommend continue to buddy tape the fingers for 2 weeks for support.  Continue to ice 2-3 times a day for 5 to 10 minutes.  Okay to use 2 Aleve in the morning and 2 at bedtime for 5 days to help with swelling and inflammation and then can decrease down to once a day for another 5 days.  If at any point you see anything concerning or have increased pain or swelling then please let us know and we will get you into take a look at it sooner.  Otherwise I will see you back in 2 weeks to recheck the fingers if you are still in the healing process

## 2020-07-13 NOTE — Progress Notes (Signed)
Pt reports that she was playing soccer on Tuesday but cannot recall hitting her hand that may have caused this injury.

## 2020-07-13 NOTE — Progress Notes (Signed)
Acute Office Visit  Subjective:    Patient ID: JUPITER BOYS, female    DOB: 06-Mar-1981, 39 y.o.   MRN: [id number]  Chief Complaint  Patient presents with  . Hand Pain    HPI Patient is in today for Right hand injury. Was playing soccer and injured her hand on Tuesday.  She does not remember specifically how she injured it she does remember falling at some point during the game.  Says was still sore by Thursday, but on Friday she actually feels like it got worse it looked more swollen and was more painful..  She has buddy taping her finger since Friday. Using Ice and elevation.    Past Medical History:  Diagnosis Date  . Asthma    exercise induced, has not used inhaler in over a year  . Hernia   . Medical history non-contributory   . Migraines     Past Surgical History:  Procedure Laterality Date  . ANTERIOR CRUCIATE LIGAMENT REPAIR  02/2013  . CYSTECTOMY  1992  . HERNIA REPAIR  07/15/11   umb hernia  . INGUINAL HERNIA REPAIR     < 3 yo  . WISDOM TOOTH EXTRACTION  2004    Family History  Problem Relation Age of Onset  . Cancer Father        prostate  . Diabetes Other        grandparent  . Stroke Other        grandparent  . Heart attack Other        grandparent  . Lung cancer Other        grandparent  . Heart disease Paternal Grandfather     Social History   Socioeconomic History  . Marital status: Married    Spouse name: Pennie Rushing  . Number of children: 2  . Years of education: Not on file  . Highest education level: Not on file  Occupational History  . Occupation: Runner, broadcasting/film/video    Comment: [employer]  Tobacco Use  . Smoking status: Never Smoker  . Smokeless tobacco: Never Used  Substance and Sexual Activity  . Alcohol use: Yes    Comment: occasionally  . Drug use: No  . Sexual activity: Yes    Partners: Male    Birth control/protection: I.U.D., None  Other Topics Concern  . 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  No children.    Social Determinants of Health   Financial Resource Strain:   . Difficulty of Paying Living Expenses: Not on file  Food Insecurity:   . Worried About Programme researcher, broadcasting/film/video in the Last Year: Not on file  . Ran Out of Food in the Last Year: Not on file  Transportation Needs:   . Lack of Transportation (Medical): Not on file  . Lack of Transportation (Non-Medical): Not on file  Physical Activity:   . Days of Exercise per Week: Not on file  . Minutes of Exercise per Session: Not on file  Stress:   . Feeling of Stress : Not on file  Social Connections:   . Frequency of Communication with Friends and Family: Not on file  . Frequency of Social Gatherings with Friends and Family: Not on file  . Attends Religious Services: Not on file  . Active Member of Clubs or Organizations: Not on file  . Attends Banker Meetings: Not on file  . Marital Status: Not on file  Intimate  Partner Violence:   . Fear of Current or Ex-Partner: Not on file  . Emotionally Abused: Not on file  . Physically Abused: Not on file  . Sexually Abused: Not on file    Outpatient Medications Prior to Visit  Medication Sig Dispense Refill  . Pediatric Multivitamins-Iron (FLINTSTONES COMPLETE PO) Take by mouth.    Marland Kitchen acetaminophen (TYLENOL) 325 MG tablet Take 650 mg by mouth every 6 (six) hours as needed.    Marland Kitchen albuterol (PROVENTIL HFA;VENTOLIN HFA) 108 (90 Base) MCG/ACT inhaler Inhale 2 puffs into the lungs every 6 (six) hours as needed. To prevent asthma attack 2 Inhaler 11  . ibuprofen (ADVIL,MOTRIN) 600 MG tablet Take 1 tablet (600 mg total) by mouth every 6 (six) hours. 30 tablet 0  . Pediatric Multivit-Minerals-C (MULTIVITAMIN GUMMIES CHILDRENS) CHEW Chew by mouth.    . SUMAtriptan (IMITREX) 100 MG tablet Take 1 tablet (100 mg total) by mouth every 2 (two) hours as needed for migraine. May repeat in 2 hours if headache persists or recurs. 10 tablet 0   No facility-administered  medications prior to visit.    Allergies  Allergen Reactions  . Amoxicillin Hives    Review of Systems     Objective:    Physical Exam Vitals reviewed.  Constitutional:      Appearance: She is well-developed.  HENT:     Head: Normocephalic and atraumatic.  Eyes:     Conjunctiva/sclera: Conjunctivae normal.  Cardiovascular:     Rate and Rhythm: Normal rate.  Pulmonary:     Effort: Pulmonary effort is normal.  Musculoskeletal:     Comments: Right hand with difficult swelling over the second and third MCPs.  Having difficulty completely extending her fingers.  She is able to make a fist but not completely flex.  She is extremely tender over the second MCP and mildly tender over the third MCP.  Nontender over the PIPs and DIPs.  Nontender over the thumb or the wrist.  Radial pulses 2+.  Skin:    General: Skin is dry.     Coloration: Skin is not pale.  Neurological:     Mental Status: She is alert and oriented to person, place, and time.  Psychiatric:        Behavior: Behavior normal.     BP 131/87   Pulse 65   Ht 5\' 3"  (1.6 m)   Wt 130 lb (59 kg)   LMP 07/13/2020   SpO2 100%   BMI 23.03 kg/m  Wt Readings from Last 3 Encounters:  07/13/20 130 lb (59 kg)  11/08/18 133 lb (60.3 kg)  10/10/18 128 lb (58.1 kg)    Health Maintenance Due  Topic Date Due  . Hepatitis C Screening  Never done  . COVID-19 Vaccine (1) Never done    There are no preventive care reminders to display for this patient.   No results found for: TSH Lab Results  Component Value Date   WBC 12.4 (H) 03/12/2014   HGB 11.5 (L) 03/12/2014   HCT 33.2 (L) 03/12/2014   MCV 86.9 03/12/2014   PLT 136 (L) 03/12/2014   Lab Results  Component Value Date   NA 135 07/30/2008   K 4.3 07/30/2008   CO2 23 07/30/2008   GLUCOSE 91 07/30/2008   BUN 16 07/30/2008   CREATININE 0.85 07/30/2008   BILITOT 0.4 07/30/2008   ALKPHOS 56 07/30/2008   AST 24 07/30/2008   ALT 17 07/30/2008   PROT 7.4  07/30/2008   ALBUMIN  4.5 07/30/2008   CALCIUM 9.3 07/30/2008   Lab Results  Component Value Date   CHOL 194 07/30/2008   Lab Results  Component Value Date   HDL 60 07/30/2008   Lab Results  Component Value Date   LDLCALC 103 (H) 07/30/2008   Lab Results  Component Value Date   TRIG 154 (H) 07/30/2008   Lab Results  Component Value Date   CHOLHDL 3.2 Ratio 07/30/2008   No results found for: HGBA1C     Assessment & Plan:   Problem List Items Addressed This Visit    None    Visit Diagnoses    Right hand pain    -  Primary   Relevant Orders   DG Hand Complete Right   Injury of right hand, initial encounter       Relevant Orders   DG Hand Complete Right     Right hand pain over the second and third MCPs-significant swelling on exam today with difficulty with extension but she is able to resist extension and she is able to flex somewhat.  X-rays negative for any sign of fracture.  Recommend splinting, anti-inflammatory, ice and follow-up in 2 weeks.  If at any point she notices worsening swelling, pain or redness over the joints to indicate infection please let us know. Fingers buddy taped while here.   No orders of the defined types were placed in this encounter.    Nani Gasser, MD

## 2021-01-21 ENCOUNTER — Ambulatory Visit (INDEPENDENT_AMBULATORY_CARE_PROVIDER_SITE_OTHER): Payer: 59 | Admitting: Obstetrics and Gynecology

## 2021-01-21 ENCOUNTER — Encounter: Payer: Self-pay | Admitting: Obstetrics and Gynecology

## 2021-01-21 ENCOUNTER — Other Ambulatory Visit (HOSPITAL_COMMUNITY)
Admission: RE | Admit: 2021-01-21 | Discharge: 2021-01-21 | Disposition: A | Discharge status: Home / Self Care | Payer: 59 | Source: Ambulatory Visit | Attending: Obstetrics and Gynecology | Admitting: Obstetrics and Gynecology

## 2021-01-21 ENCOUNTER — Other Ambulatory Visit: Payer: Self-pay

## 2021-01-21 ENCOUNTER — Ambulatory Visit (INDEPENDENT_AMBULATORY_CARE_PROVIDER_SITE_OTHER): Payer: 59

## 2021-01-21 VITALS — BP 113/69 | HR 67 | Resp 16 | Ht 63.0 in | Wt 127.0 lb

## 2021-01-21 DIAGNOSIS — Z01419 Encounter for gynecological examination (general) (routine) without abnormal findings: Secondary | ICD-10-CM | POA: Insufficient documentation

## 2021-01-21 DIAGNOSIS — Z30432 Encounter for removal of intrauterine contraceptive device: Secondary | ICD-10-CM

## 2021-01-21 DIAGNOSIS — T8332XD Displacement of intrauterine contraceptive device, subsequent encounter: Secondary | ICD-10-CM

## 2021-01-21 DIAGNOSIS — T8332XA Displacement of intrauterine contraceptive device, initial encounter: Secondary | ICD-10-CM

## 2021-01-21 NOTE — Progress Notes (Signed)
Subjective:     [name] Green is a 40 y.o. female P3 with BMI 22 who is here for a comprehensive physical exam. The patient reports persistent irregular spotting associated with IUD. Mirena IUD has since expired and patient is ready for removal. She plans on using vasectomy for contraception. Patient is without any other complaints. She denies pelvic pain or abnormal discharge. She denies urinary incontinence.  Past Medical History:  Diagnosis Date  . Asthma    exercise induced, has not used inhaler in over a year  . Hernia   . Medical history non-contributory   . Migraines    Past Surgical History:  Procedure Laterality Date  . ANTERIOR CRUCIATE LIGAMENT REPAIR  02/2013  . CYSTECTOMY  1992  . HERNIA REPAIR  07/15/11   umb hernia  . INGUINAL HERNIA REPAIR     < 3 yo  . WISDOM TOOTH EXTRACTION  2004   Family History  Problem Relation Age of Onset  . Cancer Father        prostate  . Diabetes Other        grandparent  . Stroke Other        grandparent  . Heart attack Other        grandparent  . Lung cancer Other        grandparent  . Heart disease Paternal Grandfather     Social History   Socioeconomic History  . Marital status: Married    Spouse name: Pennie Rushing  . Number of children: 2  . Years of education: Not on file  . Highest education level: Not on file  Occupational History  . Occupation: Runner, broadcasting/film/video    Comment: [employer]  Tobacco Use  . Smoking status: Never Smoker  . Smokeless tobacco: Never Used  Vaping Use  . Vaping Use: Never used  Substance and Sexual Activity  . Alcohol use: Yes    Comment: occasionally  . Drug use: No  . Sexual activity: Yes    Partners: Male    Birth control/protection: I.U.D.  Other Topics Concern  . 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    Social Determinants of Health   Financial Resource Strain: Not on file  Food Insecurity: Not on file  Transportation Needs:  Not on file  Physical Activity: Not on file  Stress: Not on file  Social Connections: Not on file  Intimate Partner Violence: Not on file   Health Maintenance  Topic Date Due  . COVID-19 Vaccine (1) Never done  . Hepatitis C Screening  Never done  . INFLUENZA VACCINE  04/05/2021  . PAP SMEAR-Modifier  08/25/2023  . TETANUS/TDAP  01/07/2024  . HIV Screening  Completed  . HPV VACCINES  Aged Out      Review of Systems Pertinent items noted in HPI and remainder of comprehensive ROS otherwise negative.   Objective:  Blood pressure 113/69, pulse 67, resp. rate 16, height 5\' 3"  (1.6 m), weight 127 lb (57.6 kg).     GENERAL: Well-developed, well-nourished female in no acute distress.  HEENT: Normocephalic, atraumatic. Sclerae anicteric.  NECK: Supple. Normal thyroid.  LUNGS: Clear to auscultation bilaterally.  HEART: Regular rate and rhythm. BREASTS: Symmetric in size. No palpable masses or lymphadenopathy, skin changes, or nipple drainage. ABDOMEN: Soft, nontender, nondistended. No organomegaly. PELVIC: Normal external female genitalia. Vagina is pink and rugated.  Normal discharge. Normal appearing cervix. Uterus is normal in size.  No adnexal mass or tenderness. EXTREMITIES: No cyanosis, clubbing, or edema, 2+ distal pulses.    Assessment:    Healthy female exam.      Plan:    Pap smear collected GYNECOLOGY CLINIC PROCEDURE NOTE  IUD Removal  Patient identified, informed consent performed, consent signed.  Patient was in the dorsal lithotomy position, normal external genitalia was noted.  A speculum was placed in the patient's vagina, normal discharge was noted, no lesions. The cervix was visualized, no lesions, no abnormal discharge.  The strings of the IUD were not visualized, so Kelly forceps were introduced into the endometrial cavity several passes without successful removal of IUD. Patient tolerated the procedure well.   Pelvic ultrasound will be ordered and patient  will be scheduled for removal under anesthesia    See After Visit Summary for Counseling Recommendations

## 2021-01-25 ENCOUNTER — Telehealth: Payer: Self-pay

## 2021-01-25 NOTE — Telephone Encounter (Addendum)
Spoke with pt and she is aware of U/S results not visualizing IUD. Pt is aware Pelvic xray order has been placed and imaging dept will contact her to schedule. Pt expressed understanding.   ----- Message from Catalina Antigua, MD sent at 01/25/2021  8:29 AM EDT ----- Please inform patient of normal ultrasound without the visualizations of the IUD. I ordered a pelvic x-ray to confirm that the IUD has not migrated out of her uterus. If the IUD is not visualized on the x-ray, it is possible that it may have fallen out. I will contact her with the results

## 2021-01-26 LAB — CYTOLOGY - PAP
Comment: NEGATIVE
Diagnosis: NEGATIVE
High risk HPV: NEGATIVE

## 2021-02-23 ENCOUNTER — Other Ambulatory Visit: Payer: Self-pay

## 2021-02-23 ENCOUNTER — Ambulatory Visit: Payer: 59 | Admitting: Sports Medicine

## 2021-02-23 DIAGNOSIS — S4992XA Unspecified injury of left shoulder and upper arm, initial encounter: Secondary | ICD-10-CM | POA: Insufficient documentation
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
## 2021-05-06 ENCOUNTER — Other Ambulatory Visit: Payer: Self-pay

## 2021-05-06 ENCOUNTER — Ambulatory Visit (INDEPENDENT_AMBULATORY_CARE_PROVIDER_SITE_OTHER): Payer: 59

## 2021-05-06 DIAGNOSIS — T8332XA Displacement of intrauterine contraceptive device, initial encounter: Secondary | ICD-10-CM

## 2021-05-13 ENCOUNTER — Telehealth (INDEPENDENT_AMBULATORY_CARE_PROVIDER_SITE_OTHER): Payer: 59 | Admitting: Obstetrics and Gynecology

## 2021-05-13 ENCOUNTER — Encounter: Payer: Self-pay | Admitting: Obstetrics and Gynecology

## 2021-05-13 DIAGNOSIS — T8332XD Displacement of intrauterine contraceptive device, subsequent encounter: Secondary | ICD-10-CM

## 2021-05-13 NOTE — Progress Notes (Signed)
GYNECOLOGY VIRTUAL VISIT ENCOUNTER NOTE  Provider location: [facility] [place] [facility]   Patient location: Home  I connected with [name] Green on 05/13/21 at  1:10 PM EDT by MyChart Video Encounter and verified that I am speaking with the correct person using two identifiers.   I discussed the limitations, risks, security and privacy concerns of performing an evaluation and management service virtually and the availability of in person appointments. I also discussed with the patient that there may be a patient responsible charge related to this service. The patient expressed understanding and agreed to proceed.   History:  [name] Green is a 40 y.o. [id number] female being evaluated today to discuss results of recent pelvic x-ray. Patient had an IUD inserted in 2015 during a postpartum visit. Several attempts were made to remove the IUD since 2019 without success. Strings were not visualized during these exams. Pelvic ultrasound did not show an IUD inside the uterus.  She denies any abnormal vaginal discharge, bleeding, pelvic pain or other concerns.       Past Medical History:  Diagnosis Date   Asthma    exercise induced, has not used inhaler in over a year   Hernia    Medical history non-contributory    Migraines    Past Surgical History:  Procedure Laterality Date   ANTERIOR CRUCIATE LIGAMENT REPAIR  02/2013   CYSTECTOMY  1992   HERNIA REPAIR  07/15/11   umb hernia   INGUINAL HERNIA REPAIR     < 3 yo   WISDOM TOOTH EXTRACTION  2004   The following portions of the patient's history were reviewed and updated as appropriate: allergies, current medications, past family history, past medical history, past social history, past surgical history and problem list.   Health Maintenance:  Normal pap and negative HRHPV on 01/2021.    Review of Systems:  Pertinent items noted in HPI and remainder of comprehensive ROS otherwise negative.  Physical Exam:    General:  Alert, oriented and cooperative. Patient appears to be in no acute distress.  Mental Status: Normal mood and affect. Normal behavior. Normal judgment and thought content.   Respiratory: Normal respiratory effort, no problems with respiration noted  Rest of physical exam deferred due to type of encounter  Labs and Imaging No results found for this or any previous visit (from the past 336 hour(s)). DG Pelvis 1-2 Views  Result Date: 05/09/2021 CLINICAL DATA:  Lost IUD strings. Could not see IUD visualized on ultrasound in May of this year. EXAM: PELVIS - 1-2 VIEW COMPARISON:  Pelvic ultrasound 01/21/2021 FINDINGS: IUD seen in the pelvis located to the left of midline. The stem is directed laterally towards the left hip joint. T arms are oriented to the right. The bony pelvis is intact. IMPRESSION: IUD is seen in the pelvis located to the left of midline. The stem is directed laterally towards the left hip. In conjunction with no intrauterine IUD on prior pelvic ultrasound, this is suggestive of extra uterine location of the IUD. Electronically Signed   By: Narda Rutherford M.D.   On: 05/09/2021 23:51       Assessment and Plan:     1. Displacement of intrauterine contraceptive device, subsequent encounter Reviewed pelvic x-ray report demonstrating and intra-abdominal location of IUD Discussed surgical removal via laparoscopy Patient undecided on whether she desires removal of IUD at this time, as it was likely misplaced since insertion and she is currently asymptomatic Patient will contact  the office when ready for intervention       I discussed the assessment and treatment plan with the patient. The patient was provided an opportunity to ask questions and all were answered. The patient agreed with the plan and demonstrated an understanding of the instructions.   The patient was advised to call back or seek an in-person evaluation/go to the ED if the symptoms worsen or if the  condition fails to improve as anticipated.  I provided 15 minutes of face-to-face time during this encounter.   Catalina Antigua, MD [facility]

## 2022-01-05 ENCOUNTER — Telehealth: Payer: Self-pay

## 2022-01-05 NOTE — Telephone Encounter (Signed)
Left voicemail on pt's phone letting her know her annual is due soon and she can call office to schedule.  ?

## 2022-05-12 ENCOUNTER — Ambulatory Visit (INDEPENDENT_AMBULATORY_CARE_PROVIDER_SITE_OTHER): Payer: 59

## 2022-05-12 ENCOUNTER — Ambulatory Visit: Payer: 59 | Admitting: Sports Medicine

## 2022-05-12 DIAGNOSIS — S83511A Sprain of anterior cruciate ligament of right knee, initial encounter: Secondary | ICD-10-CM | POA: Diagnosis not present
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
## 2022-05-12 NOTE — Assessment & Plan Note (Addendum)
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 Unfortunately she was recently playing soccer again, planted and twisted, felt a pop and had immediate swelling. On exam she has a significant effusion, likely hemarthrosis with a positive Lachman sign. We did an aspiration and injection for symptom relief, we are going to proceed with x-rays and an MRI. Continue brace for now.  Update: Patient post ACL reconstruction now with complete recurrent ACL tear that needs to be fixed again.

## 2022-05-19 ENCOUNTER — Encounter: Payer: Self-pay | Admitting: Sports Medicine

## 2022-05-23 ENCOUNTER — Telehealth: Payer: Self-pay

## 2022-05-23 NOTE — Telephone Encounter (Signed)
[facility], they still said ligament tears need 6 weeks of conservative treatment even for ACL.  As this is completely ridiculous I have requested a peer to peer.   Scheduled for 830 am tomorrow 05/24/2022.

## 2022-05-23 NOTE — Telephone Encounter (Signed)
Insurance denied approval for imaging order. Per insurance:   Patient has not completed 6 weeks of provider directed treatment.  The provider directed treatment did not occur within the las 3 months. There was no contact with your provider after completing treatment. Symptoms must be the same or worse after treatment to support imaging request.   Provider can do a peer to peer review. Can call [phone]. Case/Ref #[id number]. Past visit summaries and imaging results were faxed to the insurance on 05/13/22.

## 2022-05-24 NOTE — Telephone Encounter (Signed)
Peer-to-peer done today, approved, approval #[id number] expires July 08, 2022.

## 2022-05-24 NOTE — Telephone Encounter (Signed)
Task completed. Referral has been updated. Imaging department notified to contact the patient for scheduling.

## 2022-05-30 ENCOUNTER — Ambulatory Visit (INDEPENDENT_AMBULATORY_CARE_PROVIDER_SITE_OTHER): Payer: 59

## 2022-05-30 DIAGNOSIS — S83511A Sprain of anterior cruciate ligament of right knee, initial encounter: Secondary | ICD-10-CM | POA: Diagnosis not present

## 2022-05-30 DIAGNOSIS — Y9366 Activity, soccer: Secondary | ICD-10-CM | POA: Diagnosis not present

## 2022-06-01 NOTE — Addendum Note (Signed)
Addended by: Silverio Decamp on: 06/01/2022 11:05 AM   Modules accepted: Orders

## 2022-07-12 ENCOUNTER — Encounter: Payer: Self-pay | Admitting: Family Medicine

## 2022-07-12 ENCOUNTER — Ambulatory Visit (INDEPENDENT_AMBULATORY_CARE_PROVIDER_SITE_OTHER): Payer: 59 | Admitting: Family Medicine

## 2022-07-12 DIAGNOSIS — Z23 Encounter for immunization: Secondary | ICD-10-CM | POA: Diagnosis not present

## 2022-07-12 NOTE — Progress Notes (Signed)
Pt here for flu shot. Afebrile,no recent illness. Vaccination given, pt tolerated well.  

## 2024-02-01 ENCOUNTER — Telehealth: Payer: Self-pay | Admitting: Family Medicine

## 2024-02-01 NOTE — Telephone Encounter (Signed)
 Patient dropped off document medical questionnaire, to be filled out by provider. Patient requested to send it back via Call Patient to pick up within ASAP. Document is located in providers tray at front office.Please advise at [facility] [phone]

## 2024-02-02 NOTE — Telephone Encounter (Signed)
Form ready to be picked up.

## 2024-02-29 ENCOUNTER — Telehealth: Payer: Self-pay

## 2024-02-29 ENCOUNTER — Other Ambulatory Visit: Payer: Self-pay | Admitting: Family Medicine

## 2024-02-29 NOTE — Telephone Encounter (Signed)
 Copied from CRM [id number]. Topic: Clinical - Request for Lab/Test Order >> Feb 29, 2024 10:47 AM Cherylann RAMAN wrote: Reason for CRM: Patient is requesting to have a TB test completed for her employer. Please contact patient at [phone]

## 2024-02-29 NOTE — Telephone Encounter (Signed)
 Copied from CRM [id number]. Topic: Clinical - Request for Lab/Test Order >> Feb 29, 2024 10:57 AM Cherylann RAMAN wrote: Reason for CRM: Patient is requesting to get all her immunizations as well as the TB test updated for her upcoming employer. Please contact patient at [phone] for scheduling.

## 2024-02-29 NOTE — Telephone Encounter (Signed)
 Copied from CRM [id number]. Topic: Clinical - Medication Refill >> Feb 29, 2024 11:00 AM Cherylann RAMAN wrote: Medication: albuterol  (VENTOLIN  HFA) 108 (90 Base) MCG/ACT inhaler  Has the patient contacted their pharmacy? No (Agent: If no, request that the patient contact the pharmacy for the refill. If patient does not wish to contact the pharmacy document the reason why and proceed with request.) No refills  (Agent: If yes, when and what did the pharmacy advise?)  This is the patient's preferred pharmacy:  [id number] [place], [place] - [address] [place] [place] [address] Phone: [phone] Fax: [phone]  Is this the correct pharmacy for this prescription? Yes If no, delete pharmacy and type the correct one.   Has the prescription been filled recently? No  Is the patient out of the medication? No 2 puffs remaining   Has the patient been seen for an appointment in the last year OR does the patient have an upcoming appointment? Yes  Can we respond through MyChart? Yes  Agent: Please be advised that Rx refills may take up to 3 business days. We ask that you follow-up with your pharmacy.

## 2024-02-29 NOTE — Telephone Encounter (Signed)
 Spoke with patient has a form to be completed  verifying no problems with sight , hearing , lungs.  Needed documentation of vaccines and TB skin test Patient not seen in office since 2023- she has scheduled an appt for 04/15/2022 and wanting to know if this can be completed at the appt as well ?

## 2024-02-29 NOTE — Telephone Encounter (Signed)
 Spoke with patient and message sent to Dr. Alvan in sperate telephone encounter.

## 2024-03-01 MED ORDER — ALBUTEROL SULFATE HFA 108 (90 BASE) MCG/ACT IN AERS: 1.0000 | INHALATION_SPRAY | Freq: Four times a day (QID) | RESPIRATORY_TRACT | 0 refills | Status: AC | PRN

## 2024-03-01 NOTE — Telephone Encounter (Signed)
 We can definitely complete the form at her visit.  And if she does want to come and have the TB test drawn ahead of time that is perfectly fine to or we can draw blood test that day for it.

## 2024-03-04 NOTE — Telephone Encounter (Signed)
 Message sent to patient via Mychart.

## 2024-03-07 NOTE — Telephone Encounter (Signed)
 See previous note

## 2024-04-14 ENCOUNTER — Encounter: Payer: Self-pay | Admitting: Family Medicine

## 2024-04-14 NOTE — Progress Notes (Unsigned)
 Complete physical exam  Patient: [name] Green   DOB: 12-09-1980   43 y.o. Female  MRN: [id number]  Subjective:    No chief complaint on file.   [name] Green is a 43 y.o. female who presents today for a complete physical exam. She reports consuming a general diet. {types:19826} She generally feels well. She reports sleeping {DESC; WELL/FAIRLY WELL/POORLY:18703}. She {does/does not:200015} have additional problems to discuss today.    Most recent fall risk assessment:    10/10/2018   11:07 AM  Fall Risk   Falls in the past year? 1   Number falls in past yr: 0   Injury with Fall? 1  Follow up Falls evaluation completed      Data saved with a previous flowsheet row definition     Most recent depression screenings:    07/13/2020    9:38 AM  PHQ 2/9 Scores  PHQ - 2 Score 0    {VISON DENTAL STD PSA (Optional):27386}  {History (Optional):23778}  Patient Care Team: Alvan Dorothyann BIRCH, MD as PCP - General   Outpatient Medications Prior to Visit  Medication Sig  . albuterol  (VENTOLIN  HFA) 108 (90 Base) MCG/ACT inhaler Inhale 1-2 puffs into the lungs every 6 (six) hours as needed for wheezing or shortness of breath.  . levonorgestrel  (MIRENA ) 20 MCG/DAY IUD 1 each by Intrauterine route once.  . Pediatric Multivitamins-Iron (FLINTSTONES COMPLETE PO) Take by mouth.  SABRA VYVANSE 20 MG capsule Take 20 mg by mouth every morning.   No facility-administered medications prior to visit.    ROS        Objective:     There were no vitals taken for this visit. {Vitals History (Optional):23777}  Physical Exam Constitutional:      Appearance: Normal appearance.  HENT:     Head: Normocephalic and atraumatic.     Right Ear: Tympanic membrane, ear canal and external ear normal.     Left Ear: Tympanic membrane, ear canal and external ear normal.     Nose: Nose normal.     Mouth/Throat:     Pharynx: Oropharynx is clear.  Eyes:     Extraocular Movements:  Extraocular movements intact.     Conjunctiva/sclera: Conjunctivae normal.     Pupils: Pupils are equal, round, and reactive to light.  Neck:     Thyroid: No thyromegaly.  Cardiovascular:     Rate and Rhythm: Normal rate and regular rhythm.  Pulmonary:     Effort: Pulmonary effort is normal.     Breath sounds: Normal breath sounds.  Abdominal:     General: Bowel sounds are normal.     Palpations: Abdomen is soft.     Tenderness: There is no abdominal tenderness.  Musculoskeletal:        General: No swelling.     Cervical back: Neck supple.  Skin:    General: Skin is warm and dry.  Neurological:     Mental Status: She is oriented to person, place, and time.  Psychiatric:        Mood and Affect: Mood normal.        Behavior: Behavior normal.     No results found for any visits on 04/15/24. {Show previous labs (optional):23779}    Assessment & Plan:    Routine Health Maintenance and Physical Exam  Immunization History  Administered Date(s) Administered  . Influenza Split 06/02/2011  . Influenza Whole 06/05/2009, 06/04/2010  . Influenza, Seasonal, Injecte, Preservative Fre 05/28/2023  . Influenza,inj,Quad PF,6+  Mos 06/28/2013, 07/28/2014, 06/11/2015, 10/20/2016, 07/11/2017, 04/20/2018, 09/24/2019, 06/08/2020, 07/12/2022  . Tdap 03/15/2012, 01/06/2014  . Unspecified SARS-COV-2 Vaccination 05/28/2023    Health Maintenance  Topic Date Due  . Hepatitis C Screening  Never done  . Pneumococcal Vaccine: 19-49 Years (1 of 2 - PCV) Never done  . Hepatitis B Vaccines (1 of 3 - 19+ 3-dose series) Never done  . HPV VACCINES (1 - 3-dose SCDM series) Never done  . DTaP/Tdap/Td (3 - Td or Tdap) 01/07/2024  . INFLUENZA VACCINE  04/05/2024  . Cervical Cancer Screening (HPV/Pap Cotest)  01/21/2026  . COVID-19 Vaccine  Completed  . HIV Screening  Completed  . Meningococcal B Vaccine  Aged Out    Discussed health benefits of physical activity, and encouraged her to engage in regular  exercise appropriate for her age and condition.  Problem List Items Addressed This Visit   None Visit Diagnoses       Wellness examination    -  Primary       Keep up a regular exercise program and make sure you are eating a healthy diet Try to eat 4 servings of dairy a day, or if you are lactose intolerant take a calcium with vitamin D daily.  Your vaccines are up to date.   No follow-ups on file.     Dorothyann Byars, MD

## 2024-04-15 ENCOUNTER — Other Ambulatory Visit: Payer: Self-pay

## 2024-04-15 ENCOUNTER — Encounter: Payer: Self-pay | Admitting: Family Medicine

## 2024-04-15 ENCOUNTER — Ambulatory Visit

## 2024-04-15 ENCOUNTER — Ambulatory Visit (INDEPENDENT_AMBULATORY_CARE_PROVIDER_SITE_OTHER): Payer: Self-pay | Admitting: Family Medicine

## 2024-04-15 VITALS — BP 124/68 | HR 71 | Ht 63.0 in | Wt 127.6 lb

## 2024-04-15 DIAGNOSIS — R6882 Decreased libido: Secondary | ICD-10-CM

## 2024-04-15 DIAGNOSIS — M79675 Pain in left toe(s): Secondary | ICD-10-CM | POA: Diagnosis not present

## 2024-04-15 DIAGNOSIS — Z111 Encounter for screening for respiratory tuberculosis: Secondary | ICD-10-CM | POA: Diagnosis not present

## 2024-04-15 DIAGNOSIS — Z0000 Encounter for general adult medical examination without abnormal findings: Secondary | ICD-10-CM

## 2024-04-15 DIAGNOSIS — Z1231 Encounter for screening mammogram for malignant neoplasm of breast: Secondary | ICD-10-CM

## 2024-04-15 DIAGNOSIS — Z23 Encounter for immunization: Secondary | ICD-10-CM

## 2024-04-15 DIAGNOSIS — Z0184 Encounter for antibody response examination: Secondary | ICD-10-CM

## 2024-04-16 ENCOUNTER — Ambulatory Visit: Payer: Self-pay | Admitting: Family Medicine

## 2024-04-16 NOTE — Progress Notes (Signed)
 HI [name] Green,  Metabolic panel overall looks good.  LDL cholesterol and triglycerides up just slightly probably somewhat genetic.  Hemoglobin looks great this time.  No anemia.  For some reason your platelet counts really high is a little unusual.  In the past it looks like you have always been on the low end.  I had like to recheck that again in a couple of weeks just to see if maybe it is a lab error.  You do have immunity for rubella but not for mumps or measles so I would recommend a second MMR vaccine for you.  We can schedule you at your convenience.  The TB is still pending.

## 2024-04-17 ENCOUNTER — Ambulatory Visit (INDEPENDENT_AMBULATORY_CARE_PROVIDER_SITE_OTHER)

## 2024-04-17 VITALS — Temp 98.4°F

## 2024-04-17 DIAGNOSIS — Z23 Encounter for immunization: Secondary | ICD-10-CM

## 2024-04-17 LAB — CMP14+EGFR
ALT: 13 IU/L (ref 0–32)
AST: 20 IU/L (ref 0–40)
Albumin: 4.8 g/dL (ref 3.9–4.9)
Alkaline Phosphatase: 49 IU/L (ref 44–121)
BUN/Creatinine Ratio: 18 (ref 9–23)
BUN: 16 mg/dL (ref 6–24)
Bilirubin Total: 0.4 mg/dL (ref 0.0–1.2)
CO2: 20 mmol/L (ref 20–29)
Calcium: 9.5 mg/dL (ref 8.7–10.2)
Chloride: 100 mmol/L (ref 96–106)
Creatinine, Ser: 0.89 mg/dL (ref 0.57–1.00)
Globulin, Total: 2.1 g/dL (ref 1.5–4.5)
Glucose: 69 mg/dL — ABNORMAL LOW (ref 70–99)
Potassium: 4.9 mmol/L (ref 3.5–5.2)
Sodium: 138 mmol/L (ref 134–144)
Total Protein: 6.9 g/dL (ref 6.0–8.5)
eGFR: 84 mL/min/1.73 (ref >59)

## 2024-04-17 LAB — CBC
Hematocrit: 42.3 % (ref 34.0–46.6)
Hemoglobin: 14.1 g/dL (ref 11.1–15.9)
MCH: 30.3 pg (ref 26.6–33.0)
MCHC: 33.3 g/dL (ref 31.5–35.7)
MCV: 91 fL (ref 79–97)
Platelets: 798 x10E3/uL — ABNORMAL HIGH (ref 150–450)
RBC: 4.66 x10E6/uL (ref 3.77–5.28)
RDW: 12.9 % (ref 11.7–15.4)
WBC: 4.9 x10E3/uL (ref 3.4–10.8)

## 2024-04-17 LAB — LIPID PANEL
Chol/HDL Ratio: 3.3 ratio (ref 0.0–4.4)
Cholesterol, Total: 210 mg/dL — ABNORMAL HIGH (ref 100–199)
HDL: 63 mg/dL (ref >39)
LDL Chol Calc (NIH): 110 mg/dL — ABNORMAL HIGH (ref 0–99)
Triglycerides: 215 mg/dL — ABNORMAL HIGH (ref 0–149)
VLDL Cholesterol Cal: 37 mg/dL (ref 5–40)

## 2024-04-17 LAB — QUANTIFERON-TB GOLD PLUS
QuantiFERON Mitogen Value: 2.74 [IU]/mL
QuantiFERON Nil Value: 0 [IU]/mL
QuantiFERON TB1 Ag Value: 0.01 [IU]/mL
QuantiFERON TB2 Ag Value: 0 [IU]/mL
QuantiFERON-TB Gold Plus: NEGATIVE

## 2024-04-17 LAB — MEASLES/MUMPS/RUBELLA IMMUNITY
MUMPS ABS, IGG: <9 [AU]/ml — ABNORMAL LOW (ref >10.9)
RUBEOLA AB, IGG: <13.5 [AU]/ml — ABNORMAL LOW (ref >16.4)
Rubella Antibodies, IGG: 3.67 {index} (ref >0.99)

## 2024-04-17 NOTE — Progress Notes (Signed)
 Hi Jacey, negative for TB.  I will send this to [facility] who has your form.

## 2024-04-17 NOTE — Progress Notes (Signed)
 Patient here for 1st MMR vaccine. Patient advised to schedule nurse visit 05/15/2024 for the 2nd MMR vaccine.  Administered Location: Right Deltoid

## 2024-04-25 NOTE — Progress Notes (Signed)
 HI [name] Green,  Your final read on toe film is negative. No fracture. Some arthritis.

## 2024-04-30 ENCOUNTER — Ambulatory Visit
Admission: RE | Admit: 2024-04-30 | Discharge: 2024-04-30 | Disposition: A | Discharge status: Home / Self Care | Source: Ambulatory Visit | Attending: Family Medicine | Admitting: Family Medicine

## 2024-04-30 DIAGNOSIS — Z1231 Encounter for screening mammogram for malignant neoplasm of breast: Secondary | ICD-10-CM

## 2024-05-03 NOTE — Progress Notes (Signed)
 Please call patient. Normal mammogram.  Repeat in 1 year.

## 2024-05-07 ENCOUNTER — Encounter: Payer: Self-pay | Admitting: Sports Medicine
